# Patient Record
Sex: Female | Born: 1995 | Hispanic: No | Marital: Single | Smoking: Current every day smoker
Health system: Southern US, Community
[De-identification: ages and names within clinical notes are randomized; demographics above are authoritative.]

## PROBLEM LIST (undated history)

## (undated) DIAGNOSIS — E063 Autoimmune thyroiditis: Secondary | ICD-10-CM

## (undated) DIAGNOSIS — Z789 Other specified health status: Secondary | ICD-10-CM

## (undated) DIAGNOSIS — E039 Hypothyroidism, unspecified: Secondary | ICD-10-CM

## (undated) DIAGNOSIS — F419 Anxiety disorder, unspecified: Secondary | ICD-10-CM

## (undated) HISTORY — DX: Anxiety disorder, unspecified: F41.9

## (undated) HISTORY — DX: Hypothyroidism, unspecified: E03.9

## (undated) HISTORY — PX: TONSILLECTOMY: SUR1361

## (undated) HISTORY — DX: Autoimmune thyroiditis: E06.3

---
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
## 2017-11-24 ENCOUNTER — Encounter (HOSPITAL_COMMUNITY): Payer: Self-pay | Admitting: Emergency Medicine

## 2017-11-24 DIAGNOSIS — Z5321 Procedure and treatment not carried out due to patient leaving prior to being seen by health care provider: Secondary | ICD-10-CM | POA: Insufficient documentation

## 2017-11-24 DIAGNOSIS — L509 Urticaria, unspecified: Secondary | ICD-10-CM | POA: Insufficient documentation

## 2017-11-24 NOTE — ED Triage Notes (Signed)
Patient reports persistent generalized itchy skin hives onset last night unrelieved by oral Benadryl , seen at an urgent care this evening received " steroid shot" and prescriptions with no relief . Airway intact , no oral swelling or respiratory distress.

## 2017-11-25 ENCOUNTER — Emergency Department (HOSPITAL_COMMUNITY)
Admission: EM | Admit: 2017-11-25 | Discharge: 2017-11-25 | Discharge status: Against Medical Advice | Payer: Self-pay | Attending: Emergency Medicine | Admitting: Emergency Medicine

## 2017-11-25 NOTE — ED Notes (Signed)
No answer when called for room x 2 

## 2017-11-25 NOTE — ED Notes (Signed)
No answer in waiting area x 3  

## 2018-11-25 ENCOUNTER — Other Ambulatory Visit: Payer: Self-pay

## 2018-11-25 ENCOUNTER — Emergency Department (HOSPITAL_BASED_OUTPATIENT_CLINIC_OR_DEPARTMENT_OTHER)
Admission: EM | Admit: 2018-11-25 | Discharge: 2018-11-25 | Disposition: A | Discharge status: Home / Self Care | Payer: PRIVATE HEALTH INSURANCE | Attending: Emergency Medicine | Admitting: Emergency Medicine

## 2018-11-25 ENCOUNTER — Encounter (HOSPITAL_BASED_OUTPATIENT_CLINIC_OR_DEPARTMENT_OTHER): Payer: Self-pay

## 2018-11-25 DIAGNOSIS — R197 Diarrhea, unspecified: Secondary | ICD-10-CM | POA: Insufficient documentation

## 2018-11-25 DIAGNOSIS — F17200 Nicotine dependence, unspecified, uncomplicated: Secondary | ICD-10-CM | POA: Insufficient documentation

## 2018-11-25 DIAGNOSIS — R112 Nausea with vomiting, unspecified: Secondary | ICD-10-CM | POA: Insufficient documentation

## 2018-11-25 DIAGNOSIS — R1084 Generalized abdominal pain: Secondary | ICD-10-CM | POA: Insufficient documentation

## 2018-11-25 LAB — CBC WITH DIFFERENTIAL/PLATELET
Abs Immature Granulocytes: 0.02 10*3/uL (ref 0.00–0.07)
Basophils Absolute: 0 10*3/uL (ref 0.0–0.1)
Basophils Relative: 0 %
Eosinophils Absolute: 0 10*3/uL (ref 0.0–0.5)
Eosinophils Relative: 1 %
HCT: 39.7 % (ref 36.0–46.0)
Hemoglobin: 13.1 g/dL (ref 12.0–15.0)
Immature Granulocytes: 0 %
Lymphocytes Relative: 15 %
Lymphs Abs: 0.9 10*3/uL (ref 0.7–4.0)
MCH: 31 pg (ref 26.0–34.0)
MCHC: 33 g/dL (ref 30.0–36.0)
MCV: 94.1 fL (ref 80.0–100.0)
Monocytes Absolute: 0.3 10*3/uL (ref 0.1–1.0)
Monocytes Relative: 5 %
Neutro Abs: 4.5 10*3/uL (ref 1.7–7.7)
Neutrophils Relative %: 79 %
Platelets: 174 10*3/uL (ref 150–400)
RBC: 4.22 MIL/uL (ref 3.87–5.11)
RDW: 13 % (ref 11.5–15.5)
WBC: 5.7 10*3/uL (ref 4.0–10.5)
nRBC: 0 % (ref 0.0–0.2)

## 2018-11-25 LAB — COMPREHENSIVE METABOLIC PANEL
ALT: 10 U/L (ref 0–44)
AST: 15 U/L (ref 15–41)
Albumin: 3.8 g/dL (ref 3.5–5.0)
Alkaline Phosphatase: 31 U/L — ABNORMAL LOW (ref 38–126)
Anion gap: 7 (ref 5–15)
BUN: 13 mg/dL (ref 6–20)
CO2: 22 mmol/L (ref 22–32)
Calcium: 8.3 mg/dL — ABNORMAL LOW (ref 8.9–10.3)
Chloride: 106 mmol/L (ref 98–111)
Creatinine, Ser: 0.74 mg/dL (ref 0.44–1.00)
GFR calc Af Amer: >60 mL/min (ref >60)
GFR calc non Af Amer: >60 mL/min (ref >60)
Glucose, Bld: 90 mg/dL (ref 70–99)
Potassium: 3.7 mmol/L (ref 3.5–5.1)
SODIUM: 135 mmol/L (ref 135–145)
Total Bilirubin: 0.9 mg/dL (ref 0.3–1.2)
Total Protein: 6.3 g/dL — ABNORMAL LOW (ref 6.5–8.1)

## 2018-11-25 LAB — URINALYSIS, ROUTINE W REFLEX MICROSCOPIC
GLUCOSE, UA: NEGATIVE mg/dL
Ketones, ur: 40 mg/dL — AB
LEUKOCYTES UA: NEGATIVE
NITRITE: NEGATIVE
PH: 5.5 (ref 5.0–8.0)
PROTEIN: NEGATIVE mg/dL
Specific Gravity, Urine: >1.03 — ABNORMAL HIGH (ref 1.005–1.030)

## 2018-11-25 LAB — URINALYSIS, MICROSCOPIC (REFLEX)

## 2018-11-25 LAB — LIPASE, BLOOD: Lipase: 23 U/L (ref 11–51)

## 2018-11-25 LAB — PREGNANCY, URINE: Preg Test, Ur: NEGATIVE

## 2018-11-25 MED ORDER — SODIUM CHLORIDE 0.9 % IV BOLUS
1000.0000 mL | Freq: Once | INTRAVENOUS | Status: AC
  Administered 2018-11-25: 1000 mL via INTRAVENOUS

## 2018-11-25 MED ORDER — KETOROLAC TROMETHAMINE 30 MG/ML IJ SOLN
30.0000 mg | Freq: Once | INTRAMUSCULAR | Status: AC
  Administered 2018-11-25: 30 mg via INTRAVENOUS
  Filled 2018-11-25: qty 1

## 2018-11-25 MED ORDER — ACETAMINOPHEN 500 MG PO TABS: 500.0000 mg | ORAL_TABLET | Freq: Four times a day (QID) | ORAL | 0 refills | Status: DC | PRN

## 2018-11-25 MED ORDER — ONDANSETRON HCL 4 MG PO TABS: 4.0000 mg | ORAL_TABLET | Freq: Four times a day (QID) | ORAL | 0 refills | Status: DC

## 2018-11-25 MED ORDER — IBUPROFEN 600 MG PO TABS: 600.0000 mg | ORAL_TABLET | Freq: Four times a day (QID) | ORAL | 0 refills | Status: DC | PRN

## 2018-11-25 MED ORDER — DICYCLOMINE HCL 20 MG PO TABS: 20.0000 mg | ORAL_TABLET | Freq: Two times a day (BID) | ORAL | 0 refills | Status: DC

## 2018-11-25 MED ORDER — HYDROMORPHONE HCL 1 MG/ML IJ SOLN: 1.0000 mg | Freq: Once | INTRAMUSCULAR | Status: DC

## 2018-11-25 NOTE — Discharge Instructions (Signed)
Take Bentyl as prescribed, as needed for cramping.  Take Zofran every 6 hours as needed for nausea or vomiting.  Take Tylenol and ibuprofen as needed for pain.  Make sure to drink plenty of fluids.  Progress your diet as tolerated.  Begin with bland foods such as bananas, rice, applesauce after you are tolerating clear liquids.  Avoid fried, greasy, fatty, spicy foods initially.  Please return to the emergency department develop any new or worsening symptoms including persistent fever over 100.4, localized right lower quadrant pain, intractable vomiting, or any other concerning symptoms.

## 2018-11-25 NOTE — ED Provider Notes (Signed)
[facility] EMERGENCY DEPARTMENT Provider Note   CSN: [id number] Arrival date & time: 11/25/18  1505     History   Chief Complaint Chief Complaint  Patient presents with  . Abdominal Pain    HPI Julie Bowman is a 23 y.o. female who is previously healthy who presents with abdominal pain, nausea, vomiting, diarrhea that began yesterday.  Her boyfriend and roommates have similar symptoms.  She describes her pain is all over, intermittent, and crampy.  She has had some radiation of pain to her back.  She denies any abnormal foods or recent travel.  She is had mostly abdominal pain with some intermittent vomiting and diarrhea.  No bloody stools.  She denies any urinary symptoms, abnormal vaginal bleeding or discharge.  She has had some associated chills, but no fevers.  She was seen at urgent care and was sent here for further evaluation.  HPI  History reviewed. No pertinent past medical history.  There are no active problems to display for this patient.   Past Surgical History:  Procedure Laterality Date  . TONSILLECTOMY       OB History   No obstetric history on file.      Home Medications    Prior to Admission medications   Medication Sig Start Date End Date Taking? Authorizing Provider  acetaminophen (TYLENOL) 500 MG tablet Take 1 tablet (500 mg total) by mouth every 6 (six) hours as needed. 11/25/18   Dilara Navarrete, Waylan Boga, PA-C  dicyclomine (BENTYL) 20 MG tablet Take 1 tablet (20 mg total) by mouth 2 (two) times daily. 11/25/18   Sully Manzi, Waylan Boga, PA-C  ibuprofen (ADVIL,MOTRIN) 600 MG tablet Take 1 tablet (600 mg total) by mouth every 6 (six) hours as needed. 11/25/18   Charnae Lill, Waylan Boga, PA-C  ondansetron (ZOFRAN) 4 MG tablet Take 1 tablet (4 mg total) by mouth every 6 (six) hours. 11/25/18   Emi Holes, PA-C    Family History Family History  Problem Relation Age of Onset  . Cancer Mother     Social History Social History   Tobacco Use  . Smoking  status: Current Every Day Smoker  . Smokeless tobacco: Never Used  Substance Use Topics  . Alcohol use: Yes    Comment: occ  . Drug use: Not Currently     Allergies   Patient has no known allergies.   Review of Systems Review of Systems  Constitutional: Negative for chills and fever.  HENT: Negative for facial swelling and sore throat.   Respiratory: Negative for shortness of breath.   Cardiovascular: Negative for chest pain.  Gastrointestinal: Positive for abdominal pain, diarrhea, nausea and vomiting.  Genitourinary: Negative for dysuria.  Musculoskeletal: Negative for back pain.  Skin: Negative for rash and wound.  Neurological: Negative for headaches.  Psychiatric/Behavioral: The patient is not nervous/anxious.      Physical Exam Updated Vital Signs BP 104/79   Pulse 84   Temp 98.8 F (37.1 C) (Oral)   Resp 16   Ht 5' (1.524 m)   Wt 50.3 kg   LMP 11/02/2018   SpO2 100%   BMI 21.68 kg/m   Physical Exam Vitals signs and nursing note reviewed.  Constitutional:      General: She is not in acute distress.    Appearance: She is well-developed. She is not diaphoretic.  HENT:     Head: Normocephalic and atraumatic.     Mouth/Throat:     Pharynx: No oropharyngeal exudate.  Eyes:  General: No scleral icterus.       Right eye: No discharge.        Left eye: No discharge.     Conjunctiva/sclera: Conjunctivae normal.     Pupils: Pupils are equal, round, and reactive to light.  Neck:     Musculoskeletal: Normal range of motion and neck supple.     Thyroid: No thyromegaly.  Cardiovascular:     Rate and Rhythm: Normal rate and regular rhythm.     Heart sounds: Normal heart sounds. No murmur. No friction rub. No gallop.   Pulmonary:     Effort: Pulmonary effort is normal. No respiratory distress.     Breath sounds: Normal breath sounds. No stridor. No wheezing or rales.  Abdominal:     General: Bowel sounds are normal. There is no distension.     Palpations:  Abdomen is soft.     Tenderness: There is abdominal tenderness in the right lower quadrant and suprapubic area. There is no right CVA tenderness, left CVA tenderness, guarding or rebound. Negative signs include Murphy's sign.  Lymphadenopathy:     Cervical: No cervical adenopathy.  Skin:    General: Skin is warm and dry.     Coloration: Skin is not pale.     Findings: No rash.  Neurological:     Mental Status: She is alert.     Coordination: Coordination normal.      ED Treatments / Results  Labs (all labs ordered are listed, but only abnormal results are displayed) Labs Reviewed  URINALYSIS, ROUTINE W REFLEX MICROSCOPIC - Abnormal; Notable for the following components:      Result Value   Color, Urine AMBER (*)    Specific Gravity, Urine >1.030 (*)    Hgb urine dipstick SMALL (*)    Bilirubin Urine SMALL (*)    Ketones, ur 40 (*)    All other components within normal limits  URINALYSIS, MICROSCOPIC (REFLEX) - Abnormal; Notable for the following components:   Bacteria, UA FEW (*)    All other components within normal limits  COMPREHENSIVE METABOLIC PANEL - Abnormal; Notable for the following components:   Calcium 8.3 (*)    Total Protein 6.3 (*)    Alkaline Phosphatase 31 (*)    All other components within normal limits  PREGNANCY, URINE  CBC WITH DIFFERENTIAL/PLATELET  LIPASE, BLOOD    EKG None  Radiology No results found.  Procedures Procedures (including critical care time)  Medications Ordered in ED Medications  sodium chloride 0.9 % bolus 1,000 mL (0 mLs Intravenous Stopped 11/25/18 1815)  sodium chloride 0.9 % bolus 1,000 mL ( Intravenous Stopped 11/25/18 1924)  ketorolac (TORADOL) 30 MG/ML injection 30 mg (30 mg Intravenous Given 11/25/18 1822)     Initial Impression / Assessment and Plan / ED Course  I have reviewed the triage vital signs and the nursing notes.  Pertinent labs & imaging results that were available during my care of the patient were  reviewed by me and considered in my medical decision making (see chart for details).  Clinical Course as of Nov 26 1514  Fri Nov 25, 2018  1821 On repeat exam, patient no longer having right lower quadrant tenderness, suprapubic pressure only.  Patient reports she has been spotting as she is about to start her menstrual cycle.  I discussed a pelvic exam with her, however she has never had a pelvic exam and declines today.  I feel this is reasonable, as this seems to be more of  a viral etiology.  I discussed, using shared decision making, the risks and benefits of CT.  It is unlikely considering patient's several sick contacts that patient has something different.  Labs are very reassuring.  She has a normal white blood cell count.  We will continue serial abdominal exams while hydrating.   [AL]    Clinical Course User Index [AL] Emi Holes, PA-C    Patient presenting with abdominal pain with nausea, vomiting, diarrhea.  She has several sick contacts at home with similar symptoms.  Her abdominal exam is very inconsistent.  There was some right lower quadrant tenderness, however then it changed to epigastric tenderness.  Using shared decision making, patient opts to defer CT at this time.  Patient understands to return if she develops persistent, localized right lower quadrant tenderness, fever over 100.4, intractable vomiting, or any other unrelenting symptoms.  Patient labs are unremarkable.  However, patient was found to be dehydrated with ketones in the urine.  Patient given 2 L of normal saline, Toradol.  Patient feeling improved.  Will discharge home with Bentyl, Zofran.  Patient understands and agrees with plan.  Patient vital stable throughout ED course and discharged in satisfactory condition.  Patient also via via my attending, Dr. Pilar Plate, who guided the patient's management and agrees with plan.  Final Clinical Impressions(s) / ED Diagnoses   Final diagnoses:  Nausea vomiting and  diarrhea  Generalized abdominal pain    ED Discharge Orders         Ordered    dicyclomine (BENTYL) 20 MG tablet  2 times daily     11/25/18 1923    ondansetron (ZOFRAN) 4 MG tablet  Every 6 hours     11/25/18 1923    ibuprofen (ADVIL,MOTRIN) 600 MG tablet  Every 6 hours PRN     11/25/18 1923    acetaminophen (TYLENOL) 500 MG tablet  Every 6 hours PRN     11/25/18 1923           Emi Holes, PA-C 11/26/18 1516    Sabas Sous, MD 11/26/18 [phone]

## 2018-11-25 NOTE — ED Triage Notes (Signed)
C/o abd pain, n/v/d started yesterday-sent by [facility]-steady gait

## 2020-09-20 ENCOUNTER — Encounter (HOSPITAL_COMMUNITY): Payer: Self-pay | Admitting: Obstetrics and Gynecology

## 2020-09-20 ENCOUNTER — Telehealth: Payer: Self-pay | Admitting: Obstetrics and Gynecology

## 2020-09-20 ENCOUNTER — Inpatient Hospital Stay (HOSPITAL_COMMUNITY): Payer: Medicaid Other

## 2020-09-20 ENCOUNTER — Inpatient Hospital Stay (HOSPITAL_COMMUNITY)
Admission: AD | Admit: 2020-09-20 | Discharge: 2020-09-20 | Disposition: A | Discharge status: Home / Self Care | Payer: Medicaid Other | Attending: Obstetrics and Gynecology | Admitting: Obstetrics and Gynecology

## 2020-09-20 ENCOUNTER — Other Ambulatory Visit: Payer: Self-pay

## 2020-09-20 DIAGNOSIS — Z87891 Personal history of nicotine dependence: Secondary | ICD-10-CM | POA: Insufficient documentation

## 2020-09-20 DIAGNOSIS — R109 Unspecified abdominal pain: Secondary | ICD-10-CM | POA: Diagnosis not present

## 2020-09-20 DIAGNOSIS — O209 Hemorrhage in early pregnancy, unspecified: Secondary | ICD-10-CM | POA: Insufficient documentation

## 2020-09-20 DIAGNOSIS — Z882 Allergy status to sulfonamides status: Secondary | ICD-10-CM | POA: Insufficient documentation

## 2020-09-20 DIAGNOSIS — O26891 Other specified pregnancy related conditions, first trimester: Secondary | ICD-10-CM | POA: Insufficient documentation

## 2020-09-20 DIAGNOSIS — Z79899 Other long term (current) drug therapy: Secondary | ICD-10-CM | POA: Insufficient documentation

## 2020-09-20 DIAGNOSIS — O4690 Antepartum hemorrhage, unspecified, unspecified trimester: Secondary | ICD-10-CM

## 2020-09-20 DIAGNOSIS — O26899 Other specified pregnancy related conditions, unspecified trimester: Secondary | ICD-10-CM

## 2020-09-20 DIAGNOSIS — Z640 Problems related to unwanted pregnancy: Secondary | ICD-10-CM | POA: Diagnosis not present

## 2020-09-20 DIAGNOSIS — O3680X0 Pregnancy with inconclusive fetal viability, not applicable or unspecified: Secondary | ICD-10-CM

## 2020-09-20 DIAGNOSIS — Z3A00 Weeks of gestation of pregnancy not specified: Secondary | ICD-10-CM

## 2020-09-20 HISTORY — DX: Other specified health status: Z78.9

## 2020-09-20 LAB — CBC WITH DIFFERENTIAL/PLATELET
Abs Immature Granulocytes: 0.03 10*3/uL (ref 0.00–0.07)
Basophils Absolute: 0 10*3/uL (ref 0.0–0.1)
Basophils Relative: 1 %
Eosinophils Absolute: 0.2 10*3/uL (ref 0.0–0.5)
Eosinophils Relative: 2 %
HCT: 41.4 % (ref 36.0–46.0)
Hemoglobin: 14 g/dL (ref 12.0–15.0)
Immature Granulocytes: 0 %
Lymphocytes Relative: 28 %
Lymphs Abs: 2.1 10*3/uL (ref 0.7–4.0)
MCH: 31.5 pg (ref 26.0–34.0)
MCHC: 33.8 g/dL (ref 30.0–36.0)
MCV: 93.2 fL (ref 80.0–100.0)
Monocytes Absolute: 0.4 10*3/uL (ref 0.1–1.0)
Monocytes Relative: 5 %
Neutro Abs: 4.7 10*3/uL (ref 1.7–7.7)
Neutrophils Relative %: 64 %
Platelets: 232 10*3/uL (ref 150–400)
RBC: 4.44 MIL/uL (ref 3.87–5.11)
RDW: 12.3 % (ref 11.5–15.5)
WBC: 7.4 10*3/uL (ref 4.0–10.5)
nRBC: 0 % (ref 0.0–0.2)

## 2020-09-20 LAB — URINALYSIS, ROUTINE W REFLEX MICROSCOPIC
Bacteria, UA: NONE SEEN
Bilirubin Urine: NEGATIVE
Glucose, UA: NEGATIVE mg/dL
Ketones, ur: 20 mg/dL — AB
Leukocytes,Ua: NEGATIVE
Nitrite: NEGATIVE
Protein, ur: NEGATIVE mg/dL
Specific Gravity, Urine: 1.012 (ref 1.005–1.030)
pH: 5 (ref 5.0–8.0)

## 2020-09-20 LAB — COMPREHENSIVE METABOLIC PANEL
ALT: 11 U/L (ref 0–44)
AST: 15 U/L (ref 15–41)
Albumin: 4.5 g/dL (ref 3.5–5.0)
Alkaline Phosphatase: 37 U/L — ABNORMAL LOW (ref 38–126)
Anion gap: 11 (ref 5–15)
BUN: 11 mg/dL (ref 6–20)
CO2: 23 mmol/L (ref 22–32)
Calcium: 9.6 mg/dL (ref 8.9–10.3)
Chloride: 102 mmol/L (ref 98–111)
Creatinine, Ser: 0.91 mg/dL (ref 0.44–1.00)
GFR, Estimated: >60 mL/min (ref >60)
Glucose, Bld: 109 mg/dL — ABNORMAL HIGH (ref 70–99)
Potassium: 3.7 mmol/L (ref 3.5–5.1)
Sodium: 136 mmol/L (ref 135–145)
Total Bilirubin: 1.2 mg/dL (ref 0.3–1.2)
Total Protein: 7.2 g/dL (ref 6.5–8.1)

## 2020-09-20 LAB — ABO/RH: ABO/RH(D): O POS

## 2020-09-20 LAB — POCT PREGNANCY, URINE: Preg Test, Ur: POSITIVE — AB

## 2020-09-20 LAB — HCG, QUANTITATIVE, PREGNANCY: hCG, Beta Chain, Quant, S: 1323 m[IU]/mL — ABNORMAL HIGH (ref <5)

## 2020-09-20 NOTE — MAU Provider Note (Signed)
History     CSN: [id number]  Arrival date and time: 09/20/20 1211   First Provider Initiated Contact with Patient 09/20/20 1341      Chief Complaint  Patient presents with  . Abdominal Pain  . Vaginal Bleeding  . Possible Pregnancy   HPI   [name] is a 24 y.o. female G1P0 @ [redacted]w[redacted]d here in MAU with complaints of spotting in the last week, none now, and concerns about where the pregnancy is located. She was seen at [facility] choice clinic today, and they were unable to see anything in the uterus on ultrasound. She came here for essentially a second opinion. She reports light pink/brown bloody discharge over the last few days.  This is an undesired pregnancy and her and partner are considering abortion. She is concerned about ectopic.  No pain now.   Nov 7 was first positive pregnancy test. Certain LMP   OB History    Gravida  1   Para      Term      Preterm      AB      Living        SAB      TAB      Ectopic      Multiple      Live Births              Past Medical History:  Diagnosis Date  . Medical history non-contributory     Past Surgical History:  Procedure Laterality Date  . TONSILLECTOMY      Family History  Problem Relation Age of Onset  . Cancer Mother     Social History   Tobacco Use  . Smoking status: Former Games developer  . Smokeless tobacco: Never Used  Vaping Use  . Vaping Use: Every day  Substance Use Topics  . Alcohol use: Not Currently    Comment: occ  . Drug use: Not Currently    Allergies:  Allergies  Allergen Reactions  . Sulfa Antibiotics Other (See Comments)    Unsure of reaction mother told her she was alergic    Medications Prior to Admission  Medication Sig Dispense Refill Last Dose  . ibuprofen (ADVIL,MOTRIN) 600 MG tablet Take 1 tablet (600 mg total) by mouth every 6 (six) hours as needed. 30 tablet 0 Past Week at Unknown time  . acetaminophen (TYLENOL) 500 MG tablet Take 1 tablet (500 mg total) by mouth  every 6 (six) hours as needed. 30 tablet 0   . dicyclomine (BENTYL) 20 MG tablet Take 1 tablet (20 mg total) by mouth 2 (two) times daily. 20 tablet 0   . ondansetron (ZOFRAN) 4 MG tablet Take 1 tablet (4 mg total) by mouth every 6 (six) hours. 12 tablet 0    Results for orders placed or performed during the hospital encounter of 09/20/20 (from the past 48 hour(s))  Urinalysis, Routine w reflex microscopic Urine, Clean Catch     Status: Abnormal   Collection Time: 09/20/20 12:46 PM  Result Value Ref Range   Color, Urine YELLOW YELLOW   APPearance CLEAR CLEAR   Specific Gravity, Urine 1.012 1.005 - 1.030   pH 5.0 5.0 - 8.0   Glucose, UA NEGATIVE NEGATIVE mg/dL   Hgb urine dipstick SMALL (A) NEGATIVE   Bilirubin Urine NEGATIVE NEGATIVE   Ketones, ur 20 (A) NEGATIVE mg/dL   Protein, ur NEGATIVE NEGATIVE mg/dL   Nitrite NEGATIVE NEGATIVE   Leukocytes,Ua NEGATIVE NEGATIVE   WBC, UA 0-5 0 -  5 WBC/hpf   Bacteria, UA NONE SEEN NONE SEEN   Squamous Epithelial / LPF 0-5 0 - 5    Comment: Performed at [facility], [address] [place], [place] [address]  Pregnancy, urine POC     Status: Abnormal   Collection Time: 09/20/20 12:56 PM  Result Value Ref Range   Preg Test, Ur POSITIVE (A) NEGATIVE    Comment:        THE SENSITIVITY OF THIS METHODOLOGY IS >24 mIU/mL   CBC with Differential/Platelet     Status: None   Collection Time: 09/20/20  2:18 PM  Result Value Ref Range   WBC 7.4 4.0 - 10.5 K/uL   RBC 4.44 3.87 - 5.11 MIL/uL   Hemoglobin 14.0 12.0 - 15.0 g/dL   HCT 60.441.4 36 - 46 %   MCV 93.2 80.0 - 100.0 fL   MCH 31.5 26.0 - 34.0 pg   MCHC 33.8 30.0 - 36.0 g/dL   RDW 54.012.3 98.111.5 - 19.115.5 %   Platelets 232 150 - 400 K/uL   nRBC 0.0 0.0 - 0.2 %   Neutrophils Relative % 64 %   Neutro Abs 4.7 1.7 - 7.7 K/uL   Lymphocytes Relative 28 %   Lymphs Abs 2.1 0.7 - 4.0 K/uL   Monocytes Relative 5 %   Monocytes Absolute 0.4 0.1 - 1.0 K/uL   Eosinophils Relative 2 %   Eosinophils  Absolute 0.2 0.0 - 0.5 K/uL   Basophils Relative 1 %   Basophils Absolute 0.0 0.0 - 0.1 K/uL   Immature Granulocytes 0 %   Abs Immature Granulocytes 0.03 0.00 - 0.07 K/uL    Comment: Performed at [facility], [address] [place], [place] [address]  Comprehensive metabolic panel     Status: Abnormal   Collection Time: 09/20/20  2:18 PM  Result Value Ref Range   Sodium 136 135 - 145 mmol/L   Potassium 3.7 3.5 - 5.1 mmol/L   Chloride 102 98 - 111 mmol/L   CO2 23 22 - 32 mmol/L   Glucose, Bld 109 (H) 70 - 99 mg/dL    Comment: Glucose reference range applies only to samples taken after fasting for at least 8 hours.   BUN 11 6 - 20 mg/dL   Creatinine, Ser 5.620.91 0.44 - 1.00 mg/dL   Calcium 9.6 8.9 - 13.010.3 mg/dL   Total Protein 7.2 6.5 - 8.1 g/dL   Albumin 4.5 3.5 - 5.0 g/dL   AST 15 15 - 41 U/L   ALT 11 0 - 44 U/L   Alkaline Phosphatase 37 (L) 38 - 126 U/L   Total Bilirubin 1.2 0.3 - 1.2 mg/dL   GFR, Estimated >86>60 >57>60 mL/min    Comment: (NOTE) Calculated using the CKD-EPI Creatinine Equation (2021)    Anion gap 11 5 - 15    Comment: Performed at [facility], [address] [place], [place] [address]  ABO/Rh     Status: None   Collection Time: 09/20/20  2:18 PM  Result Value Ref Range   ABO/RH(D) O POS    No rh immune globuloin      NOT A RH IMMUNE GLOBULIN CANDIDATE, PT RH POSITIVE Performed at [facility], [address] [place], [place] [address]   hCG, quantitative, pregnancy     Status: Abnormal   Collection Time: 09/20/20  2:18 PM  Result Value Ref Range   hCG, Beta Chain, Quant, S 1,323 (H) <5  mIU/mL    Comment:          GEST. AGE      CONC.  (mIU/mL)   <=1 WEEK        5 - 50     2 WEEKS       50 - 500     3 WEEKS       100 - 10,000     4 WEEKS     1,000 - 30,000     5 WEEKS     3,500 - 115,000   6-8 WEEKS     12,000 - 270,000    12 WEEKS     15,000 - 220,000        FEMALE AND NON-PREGNANT FEMALE:     LESS THAN 5 mIU/mL Performed at [facility], [address], [place], [place] [address]    US OB LESS THAN 14 WEEKS WITH OB TRANSVAGINAL  Result Date: 09/20/2020 CLINICAL DATA:  Cramping, vaginal bleeding EXAM: OBSTETRIC <14 WK [name] AND TRANSVAGINAL OB US TECHNIQUE: Both transabdominal and transvaginal ultrasound examinations were performed for complete evaluation of the gestation as well as the maternal uterus, adnexal regions, and pelvic cul-de-sac. Transvaginal technique was performed to assess early pregnancy. COMPARISON:  None. FINDINGS: Intrauterine gestational sac: None Yolk sac:  Not Visualized. Embryo:  Not Visualized. Cardiac Activity: Not Visualized. Maternal uterus/adnexae: Left ovary measures 2.8 x 2.5 x 2.6 cm, with possible corpus luteum cyst measuring 1.2 cm. The right ovary measures 3.0 x 1.6 x 2.2 cm. Endometrium measures 14 mm in thickness. No evidence of intrauterine pregnancy. No free fluid. IMPRESSION: 1. No evidence of intrauterine pregnancy at this time. Serial beta HCG measurements and follow-up ultrasound may be useful. 2. Probable corpus luteum cyst left ovary. Electronically Signed   By: Sharlet Salina M.D.   On: 09/20/2020 14:58   Review of Systems  Constitutional: Negative for fever.  Gastrointestinal: Negative for abdominal pain.  Genitourinary: Positive for vaginal bleeding. Negative for vaginal discharge.   Physical Exam   Blood pressure 115/81, pulse 83, temperature 99.2 F (37.3 C), temperature source Oral, resp. rate 16, height 5' (1.524 m), weight 53.8 kg, last menstrual period 08/08/2020, SpO2 100 %.  Physical Exam Constitutional:      General: She is not in acute distress.    Appearance: She is well-developed. She is not ill-appearing or toxic-appearing.  Abdominal:     Tenderness: There is no abdominal tenderness.  Genitourinary:    Comments: Cervix: Closed, thick, posterior. No blood noted on exam.  Adley Mazurowski, Harolyn Rutherford, NP  Neurological:     Mental Status: She is alert.    MAU  Course  Procedures  None  MDM  Patient is self pay w/o insurance. Did not collect vaginal swabs or HIV test today d/t cost. She is not concerned and plans to have this done at the HD. [name] reviewed with patient. Labs reviewed with patient.  O positive blood type  Assessment and Plan   A:  1. Pregnancy of unknown anatomic location   2. Abdominal pain affecting pregnancy   3. Vaginal bleeding in pregnancy   4. Unwanted pregnancy     P:  Discharge home in stable condition Ectopic precautions F/[facility]. If ? Ectopic based on repeat Quant, she may be offered treatment on Monday.  Strict return precautions, return to MAU if symptoms worsen Pelvic rest  Bleeding precautions.   Jadon Harbaugh, Victorino Dike  I, NP 09/20/2020 6:58 PM

## 2020-09-20 NOTE — MAU Note (Signed)
Went to a clinic [facility]".  They did an US,nothing showed up.  Was told it could she is early or she could have an ectopic. Has had several +HPT.  Having light cramping and spotting.

## 2020-09-23 ENCOUNTER — Ambulatory Visit (INDEPENDENT_AMBULATORY_CARE_PROVIDER_SITE_OTHER): Payer: Self-pay

## 2020-09-23 ENCOUNTER — Other Ambulatory Visit: Payer: Self-pay

## 2020-09-23 VITALS — BP 125/77 | HR 75 | Wt 121.1 lb

## 2020-09-23 DIAGNOSIS — O3680X0 Pregnancy with inconclusive fetal viability, not applicable or unspecified: Secondary | ICD-10-CM

## 2020-09-23 LAB — BETA HCG QUANT (REF LAB): hCG Quant: 4443 m[IU]/mL

## 2020-09-23 NOTE — Progress Notes (Signed)
Opened in error

## 2020-09-23 NOTE — Progress Notes (Signed)
Julie Bowman presents to [facility] for follow-up quant hCG blood draw today. She was seen in MAU for vaginal bleeding on 09/20/20. Patient denies pain or bleeding today. Discussed with patient, we are following hCG levels today. Results will be back in approximately 2 hours. Valid contact number for patient confirmed. I will call the patient with results.    Beta HCG today is 4443 which has risen from 1,323 on 09/20/20. Results and patient history reviewed with Donavan Foil, MD who states this is an appropriate rise and pt should be scheduled for [name] in 7-10 days. Called pt at 1200; VM is full, cannot leave message. Will call again later today.  Pt returned phone call and left VM on nurse line. Called pt at 1702. Result and provider recommendation given. First available [name] scheduled on 10/07/20 at 0800; 0745. Pt aware she will be called with results by a provider after the [name] visit.  [name] 09/23/2020 9:03 AM

## 2020-09-24 NOTE — Progress Notes (Signed)
Patient was assessed and managed by nursing staff during this encounter. I have reviewed the chart and agree with the documentation and plan. I have also made any necessary editorial changes.  Warden Fillers, MD 09/24/2020 9:57 AM

## 2020-09-25 NOTE — Telephone Encounter (Signed)
error 

## 2020-09-27 ENCOUNTER — Other Ambulatory Visit: Payer: Self-pay

## 2020-09-27 ENCOUNTER — Inpatient Hospital Stay (HOSPITAL_COMMUNITY): Payer: Medicaid Other

## 2020-09-27 ENCOUNTER — Inpatient Hospital Stay (HOSPITAL_COMMUNITY)
Admission: AD | Admit: 2020-09-27 | Discharge: 2020-09-27 | Disposition: A | Discharge status: Home / Self Care | Payer: Medicaid Other | Attending: Obstetrics and Gynecology | Admitting: Obstetrics and Gynecology

## 2020-09-27 ENCOUNTER — Encounter (HOSPITAL_COMMUNITY): Payer: Self-pay | Admitting: Obstetrics and Gynecology

## 2020-09-27 DIAGNOSIS — O209 Hemorrhage in early pregnancy, unspecified: Secondary | ICD-10-CM | POA: Insufficient documentation

## 2020-09-27 DIAGNOSIS — Z3A01 Less than 8 weeks gestation of pregnancy: Secondary | ICD-10-CM | POA: Insufficient documentation

## 2020-09-27 DIAGNOSIS — Z3491 Encounter for supervision of normal pregnancy, unspecified, first trimester: Secondary | ICD-10-CM

## 2020-09-27 DIAGNOSIS — Z6790 Unspecified blood type, Rh positive: Secondary | ICD-10-CM

## 2020-09-27 DIAGNOSIS — Z87891 Personal history of nicotine dependence: Secondary | ICD-10-CM | POA: Insufficient documentation

## 2020-09-27 DIAGNOSIS — Z6710 Type A blood, Rh positive: Secondary | ICD-10-CM

## 2020-09-27 DIAGNOSIS — O3680X0 Pregnancy with inconclusive fetal viability, not applicable or unspecified: Secondary | ICD-10-CM

## 2020-09-27 LAB — HCG, QUANTITATIVE, PREGNANCY: hCG, Beta Chain, Quant, S: 19814 m[IU]/mL — ABNORMAL HIGH (ref <5)

## 2020-09-27 NOTE — MAU Note (Signed)
Presents with c/o VB that began @ 1100 this morning. States VB is similar to menstrual cycle.  Reports slight abdominal cramping.  Denies recent intercourse.

## 2020-09-27 NOTE — Discharge Instructions (Signed)

## 2020-09-27 NOTE — MAU Provider Note (Signed)
Chief Complaint: Vaginal Bleeding   First Provider Initiated Contact with Patient 09/27/20 1227      SUBJECTIVE HPI: Julie Bowman is a 24 y.o. G1P0 at [redacted]w[redacted]d by LMP who presents to maternity admissions reporting onset of menstrual like bleeding today after exercising. She reports mild abdominal cramping associated with the bleeding. She has not tried any treatments.     Location: lower abdomen Quality: cramping Severity: 2/10 on pain scale Duration: 1 day Timing: intermittent Modifying factors: none Associated signs and symptoms: vaginal bleeding  HPI  Past Medical History:  Diagnosis Date  . Medical history non-contributory    Past Surgical History:  Procedure Laterality Date  . TONSILLECTOMY     Social History   Socioeconomic History  . Marital status: Single    Spouse name: Not on file  . Number of children: Not on file  . Years of education: Not on file  . Highest education level: Not on file  Occupational History  . Not on file  Tobacco Use  . Smoking status: Former Games developer  . Smokeless tobacco: Never Used  Vaping Use  . Vaping Use: Every day  Substance and Sexual Activity  . Alcohol use: Not Currently    Comment: occ  . Drug use: Not Currently  . Sexual activity: Yes    Birth control/protection: None  Other Topics Concern  . Not on file  Social History Narrative  . Not on file   Social Determinants of Health   Financial Resource Strain:   . Difficulty of Paying Living Expenses: Not on file  Food Insecurity: No Food Insecurity  . Worried About Programme researcher, broadcasting/film/video in the Last Year: Never true  . Ran Out of Food in the Last Year: Never true  Transportation Needs: No Transportation Needs  . Lack of Transportation (Medical): No  . Lack of Transportation (Non-Medical): No  Physical Activity:   . Days of Exercise per Week: Not on file  . Minutes of Exercise per Session: Not on file  Stress:   . Feeling of Stress : Not on file  Social Connections:   .  Frequency of Communication with Friends and Family: Not on file  . Frequency of Social Gatherings with Friends and Family: Not on file  . Attends Religious Services: Not on file  . Active Member of Clubs or Organizations: Not on file  . Attends Banker Meetings: Not on file  . Marital Status: Not on file  Intimate Partner Violence:   . Fear of Current or Ex-Partner: Not on file  . Emotionally Abused: Not on file  . Physically Abused: Not on file  . Sexually Abused: Not on file   No current facility-administered medications on file prior to encounter.   Current Outpatient Medications on File Prior to Encounter  Medication Sig Dispense Refill  . acetaminophen (TYLENOL) 500 MG tablet Take 1 tablet (500 mg total) by mouth every 6 (six) hours as needed. 30 tablet 0  . dicyclomine (BENTYL) 20 MG tablet Take 1 tablet (20 mg total) by mouth 2 (two) times daily. 20 tablet 0   Allergies  Allergen Reactions  . Sulfa Antibiotics Other (See Comments)    Unsure of reaction mother told her she was alergic    ROS:  Review of Systems  Constitutional: Negative for chills, fatigue and fever.  Respiratory: Negative for shortness of breath.   Cardiovascular: Negative for chest pain.  Gastrointestinal: Positive for abdominal pain.  Genitourinary: Positive for vaginal bleeding. Negative for difficulty  urinating, dysuria, flank pain, pelvic pain, vaginal discharge and vaginal pain.  Neurological: Negative for dizziness and headaches.  Psychiatric/Behavioral: Negative.      I have reviewed patient's Past Medical Hx, Surgical Hx, Family Hx, Social Hx, medications and allergies.   Physical Exam   No data found. Constitutional: Well-developed, well-nourished female in no acute distress.  Cardiovascular: normal rate Respiratory: normal effort GI: Abd soft, non-tender. Pos BS x 4 MS: Extremities nontender, no edema, normal ROM Neurologic: Alert and oriented x 4.  GU: Neg  CVAT.  PELVIC EXAM: Deferred Pad count, scant light red bleeding on pad worn in MAU 1+ hour    LAB RESULTS Results for orders placed or performed during the hospital encounter of 09/27/20 (from the past 24 hour(s))  hCG, quantitative, pregnancy     Status: Abnormal   Collection Time: 09/27/20 12:44 PM  Result Value Ref Range   hCG, Beta Chain, Quant, S 19,814 (H) <5 mIU/mL    --/--/O POS (11/12 1418)  IMAGING US OB Transvaginal  Result Date: 09/27/2020 CLINICAL DATA:  Vaginal bleeding.  Positive pregnancy test. EXAM: OBSTETRIC <14 WK [name] AND TRANSVAGINAL OB US TECHNIQUE: Both transabdominal and transvaginal ultrasound examinations were performed for complete evaluation of the gestation as well as the maternal uterus, adnexal regions, and pelvic cul-de-sac. Transvaginal technique was performed to assess early pregnancy. COMPARISON:  None. FINDINGS: Intrauterine gestational sac: Single. Yolk sac:  Not visualized. Embryo:  Not visualized. Cardiac Activity: N/A MSD: 9.4 mm   5 w   5 d Subchorionic hemorrhage:  None visualized. Maternal uterus/adnexae: Maternal ovaries unremarkable. IMPRESSION: Single intrauterine gestational sac identified without visible yolk sac or embryo. Estimated gestational age by mean sac diameter is 5 weeks 5 days. Electronically Signed   By: Kennith Center M.D.   On: 09/27/2020 14:15   US OB LESS THAN 14 WEEKS WITH OB TRANSVAGINAL  Result Date: 09/20/2020 CLINICAL DATA:  Cramping, vaginal bleeding EXAM: OBSTETRIC <14 WK [name] AND TRANSVAGINAL OB US TECHNIQUE: Both transabdominal and transvaginal ultrasound examinations were performed for complete evaluation of the gestation as well as the maternal uterus, adnexal regions, and pelvic cul-de-sac. Transvaginal technique was performed to assess early pregnancy. COMPARISON:  None. FINDINGS: Intrauterine gestational sac: None Yolk sac:  Not Visualized. Embryo:  Not Visualized. Cardiac Activity: Not Visualized. Maternal  uterus/adnexae: Left ovary measures 2.8 x 2.5 x 2.6 cm, with possible corpus luteum cyst measuring 1.2 cm. The right ovary measures 3.0 x 1.6 x 2.2 cm. Endometrium measures 14 mm in thickness. No evidence of intrauterine pregnancy. No free fluid. IMPRESSION: 1. No evidence of intrauterine pregnancy at this time. Serial beta HCG measurements and follow-up ultrasound may be useful. 2. Probable corpus luteum cyst left ovary. Electronically Signed   By: Sharlet Salina M.D.   On: 09/20/2020 14:58    MAU Management/MDM: Orders Placed This Encounter  Procedures  . US OB Transvaginal  . US OB Transvaginal  . hCG, quantitative, pregnancy  . Discharge patient    No orders of the defined types were placed in this encounter.   Findings today could represent a normal early pregnancy, spontaneous abortion or ectopic pregnancy which can be life-threatening.  Ectopic precautions were given to the patient with plan to return in 48 hours for repeat quant hcg to evaluate pregnancy development. Given hcg level today and large amount of bleeding, most likely failed pregnancy but will follow hcgs and repeat US as needed.    ASSESSMENT 1. Vaginal bleeding in pregnancy, first trimester  2. Blood type, Rh positive   3. Pregnancy, location unknown     PLAN Discharge home Allergies as of 09/27/2020      Reactions   Sulfa Antibiotics Other (See Comments)   Unsure of reaction mother told her she was alergic      Medication List    STOP taking these medications   ibuprofen 600 MG tablet Commonly known as: ADVIL   ondansetron 4 MG tablet Commonly known as: ZOFRAN     TAKE these medications   acetaminophen 500 MG tablet Commonly known as: TYLENOL Take 1 tablet (500 mg total) by mouth every 6 (six) hours as needed.   dicyclomine 20 MG tablet Commonly known as: BENTYL Take 1 tablet (20 mg total) by mouth 2 (two) times daily.       Follow-up Information    [facility]  Follow up.   Specialty: Radiology Why: The office will call you to schedule a follow up ultrasound in 1 week. Return to MAU as needed for emergencies.  Contact information: [phone] [place] [phone]              Sharen Counter Certified Nurse-Midwife 09/28/2020  12:07 PM

## 2020-09-30 ENCOUNTER — Telehealth: Payer: Self-pay | Admitting: Advanced Practice Midwife

## 2020-09-30 ENCOUNTER — Telehealth: Payer: Self-pay | Admitting: Certified Nurse Midwife

## 2020-09-30 ENCOUNTER — Other Ambulatory Visit: Payer: Self-pay

## 2020-09-30 ENCOUNTER — Inpatient Hospital Stay (HOSPITAL_COMMUNITY)
Admission: AD | Admit: 2020-09-30 | Discharge: 2020-09-30 | Disposition: A | Discharge status: Home / Self Care | Payer: Medicaid Other | Attending: Obstetrics & Gynecology | Admitting: Obstetrics & Gynecology

## 2020-09-30 DIAGNOSIS — O3680X0 Pregnancy with inconclusive fetal viability, not applicable or unspecified: Secondary | ICD-10-CM | POA: Diagnosis present

## 2020-09-30 DIAGNOSIS — Z3A09 9 weeks gestation of pregnancy: Secondary | ICD-10-CM | POA: Diagnosis not present

## 2020-09-30 DIAGNOSIS — Z3A00 Weeks of gestation of pregnancy not specified: Secondary | ICD-10-CM

## 2020-09-30 LAB — HCG, QUANTITATIVE, PREGNANCY: hCG, Beta Chain, Quant, S: 41083 m[IU]/mL — ABNORMAL HIGH (ref <5)

## 2020-09-30 NOTE — Telephone Encounter (Signed)
Patient returned voicemail from [place]. Pt states she was unaware of repeat stat Quant hCG. She verbalized her intention to come to MAU this evening for repeat blood work.   Clayton Bibles, MSN, CNM Certified Nurse Midwife, [employer] for [employer], [facility] 09/30/20 1:08 PM

## 2020-09-30 NOTE — Progress Notes (Addendum)
Pt left unit prior to discharge instructions being reviewed & given.  MSE completed by S. Weinhold, CNM prior to pt departure.  POC & F/U discussed.

## 2020-09-30 NOTE — MAU Provider Note (Signed)
°  S [name] is a 24 y.o. G1P0 patient who presents to MAU for repeat Quant hCG. Patient states her abdominal pain has improved since she was evaluated in MAU on 09/27/2020, She also states her heavy bleeding has resolved.  Patient is planning elective pregnancy termination but has been told by her clinic that she cannot proceed until IUP is confirmed. Patient and her support person have multiple questions regarding ectopic workup over time, overall plan of care, and timing of her repeat ultrasound, which is currently scheduled for 11/29.  O BP 110/73 (BP Location: Right Arm)    Pulse 72    Temp 98.2 F (36.8 C) (Oral)    Resp 20    Ht 5' (1.524 m)    Wt 54.5 kg    LMP 08/08/2020    SpO2 100%    BMI 23.46 kg/m    Physical Exam Vitals and nursing note reviewed. Exam conducted with a chaperone present.  Constitutional:      General: She is not in acute distress.    Appearance: Normal appearance. She is not ill-appearing.  Cardiovascular:     Rate and Rhythm: Normal rate.     Pulses: Normal pulses.  Pulmonary:     Effort: Pulmonary effort is normal.  Skin:    General: Skin is warm and dry.     Capillary Refill: Capillary refill takes less than 2 seconds.  Neurological:     General: No focal deficit present.     Mental Status: She is alert.  Psychiatric:        Mood and Affect: Mood normal.        Behavior: Behavior normal.        Thought Content: Thought content normal.        Judgment: Judgment normal.    A Medical screening exam complete Appropriate rise in Quant hCG (16,109 on 11/19)  Results for orders placed or performed during the hospital encounter of 09/30/20 (from the past 24 hour(s))  hCG, quantitative, pregnancy     Status: Abnormal   Collection Time: 09/30/20  6:30 PM  Result Value Ref Range   hCG, Beta Chain, Quant, S 41,083 (H) <5 mIU/mL   P Discharge from MAU in stable condition Warning signs for worsening condition that would warrant emergency  follow-up discussed Patient may return to MAU as needed   Clayton Bibles, [facility] 09/30/2020 7:49 PM

## 2020-09-30 NOTE — Discharge Instructions (Signed)
Ectopic Pregnancy ° °An ectopic pregnancy happens when a fertilized egg grows outside the womb (uterus). The fertilized egg cannot stay alive outside of the womb. This problem often happens in a fallopian tube. It is often caused by damage to the tube. °If this problem is found early, you may be treated with medicine that stops the egg from growing. If your tube tears or bursts open (ruptures), you will bleed inside. Often, there is very bad pain in the lower belly. This is an emergency. You will need surgery. Get help right away. °Follow these instructions at home: °After being treated with medicine or surgery: °· Rest and limit your activity for as long as told by your doctor. °· Until your doctor says that it is safe: °? Do not lift anything that is heavier than 10 lb (4.5 kg) or the limit that your doctor tells you. °? Avoid exercise and any movement that takes a lot of effort. °· To prevent problems when pooping (constipation): °? Eat a healthy diet. This includes: °§ Fruits. °§ Vegetables. °§ Whole grains. °? Drink 6-8 glasses of water a day. °Contact a doctor if: °Get help right away if: °· You have sudden and very bad pain in your belly. °· You have very bad pain in your shoulders or neck. °· You have pain that gets worse and is not helped by medicine. °· You have: °? A fever or chills. °? Vaginal bleeding. °? Redness or swelling at the site of a surgical cut (incision). °· You feel sick to your stomach (nauseous) or you throw up (vomit). °· You feel dizzy or weak. °· You feel light-headed or you pass out (faint). °Summary °· An ectopic pregnancy happens when a fertilized egg grows outside the womb (uterus). °· If this problem is found early, you may be treated with medicine that stops the egg from growing. °· If your tube tears or bursts open (ruptures), you will need surgery. This is an emergency. Get help right away. °This information is not intended to replace advice given to you by your health care  provider. Make sure you discuss any questions you have with your health care provider. °Document Revised: 10/08/2017 Document Reviewed: 11/19/2016 °Elsevier Patient Education © 2020 [employer] Inc. ° °

## 2020-09-30 NOTE — Telephone Encounter (Signed)
Pt was to return to MAU for qhcg on 11/21 but did not show. Message left to call back. Needs to return to MAU for stat qhcg.

## 2020-09-30 NOTE — MAU Note (Signed)
Presents for HCG level.  Reports spotting with mild cramping.

## 2020-10-07 ENCOUNTER — Telehealth: Payer: Self-pay | Admitting: Medical

## 2020-10-07 ENCOUNTER — Ambulatory Visit
Admission: RE | Admit: 2020-10-07 | Discharge: 2020-10-07 | Disposition: A | Discharge status: Home / Self Care | Payer: Medicaid Other | Source: Ambulatory Visit | Attending: Obstetrics and Gynecology | Admitting: Obstetrics and Gynecology

## 2020-10-07 ENCOUNTER — Other Ambulatory Visit: Payer: Self-pay

## 2020-10-07 DIAGNOSIS — O3680X0 Pregnancy with inconclusive fetal viability, not applicable or unspecified: Secondary | ICD-10-CM | POA: Insufficient documentation

## 2020-10-07 NOTE — Telephone Encounter (Signed)
LM for patient to return call for [name] results from 10/07/2020  Vonzella Nipple, PA-C 10/07/2020 11:15 AM

## 2020-10-07 NOTE — Telephone Encounter (Signed)
Julie Bowman return call today at 12:46 PM and I confirmed patient's identity using two patient identifiers. [name] results from earlier today were reviewed. Patient is not scheduled for new OB visit a this time. She is unsure is she will continue the pregnancy yet. Will call back to schedule if needed. Patient voiced understanding and had no further questions.   US OB Transvaginal  Result Date: 10/07/2020 CLINICAL DATA:  24 year old pregnant female presents for assessment of pregnancy location and viability. No acute symptoms reported. Quantitative beta HCG [id number] on 09/27/2020. EDC by LMP: 05/15/2021, projecting to an expected gestational age of [redacted] weeks 4 days. EXAM: TRANSVAGINAL OB ULTRASOUND TECHNIQUE: Transvaginal ultrasound was performed for complete evaluation of the gestation as well as the maternal uterus, adnexal regions, and pelvic cul-de-sac. COMPARISON:  09/27/2020 obstetric scan. FINDINGS: Intrauterine gestational sac: Single Yolk sac:  Visualized. Embryo:  Visualized. Cardiac Activity: Visualized. Heart Rate: 129 bpm CRL:   6.1 mm   6 w 3 d                  [name] EDC: 05/30/2021 Subchorionic hemorrhage:  None visualized. Maternal uterus/adnexae: Right ovary measures 3.5 x 2.2 x 2.6 cm. Left ovary measures 4.1 x 2.4 x 3.4 cm and contains a corpus luteum. No suspicious ovarian or adnexal masses. IMPRESSION: Single living intrauterine gestation at 6 weeks 3 days by crown-rump length, discordant with the expected gestational age of [redacted] weeks 4 days by provided menstrual dating. No acute gestational abnormality. Consider follow-up obstetric scan in 4 weeks to assess fetal growth, as clinically warranted. Electronically Signed   By: Delbert Phenix M.D.   On: 10/07/2020 09:07    Vonzella Nipple, PA-C 10/07/2020 12:46 PM

## 2022-01-01 IMAGING — US US OB < 14 WEEKS - US OB TV
1 series · 15 of 28 positions shown · non-contrast
Comparison: None.

CLINICAL DATA: Cramping, vaginal bleeding

EXAM:
OBSTETRIC <14 WK US AND TRANSVAGINAL OB US
TECHNIQUE: Both transabdominal and transvaginal ultrasound examinations were
performed for complete evaluation of the gestation as well as the
maternal uterus, adnexal regions, and pelvic cul-de-sac.
Transvaginal technique was performed to assess early pregnancy.

[Series 1: us ob < 14 weeks - us ob tv · 15 of 52 slices shown]
[im 1/52]
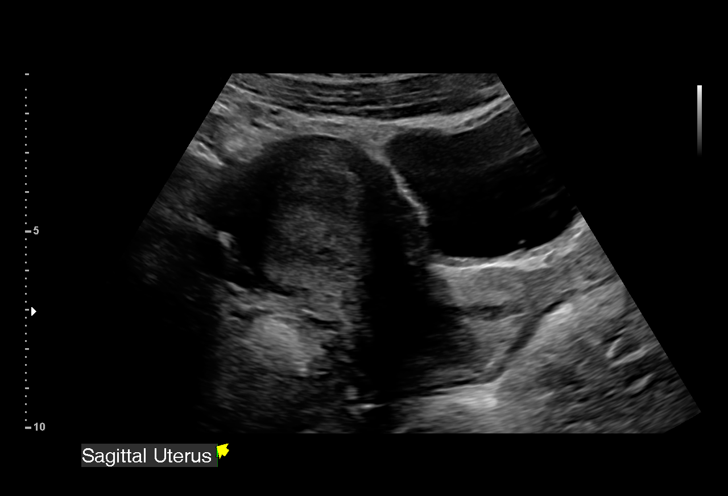
[im 4/52]
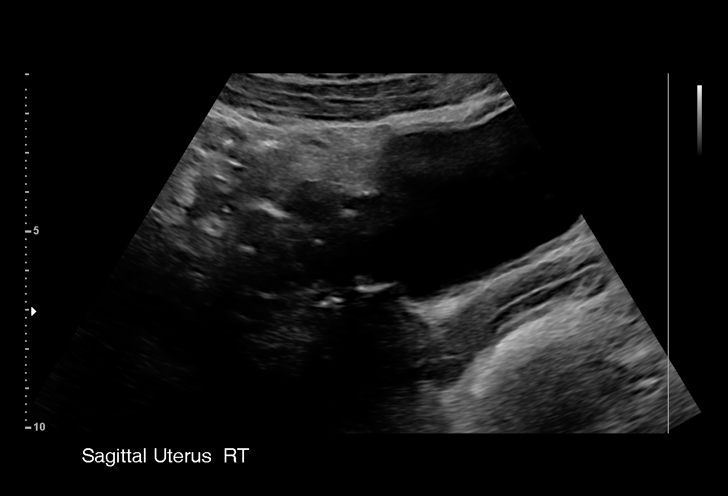
[im 8/52]
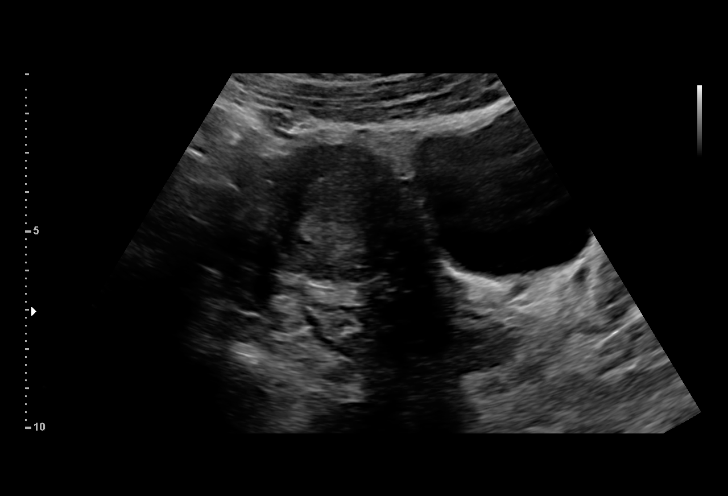
[im 12/52]
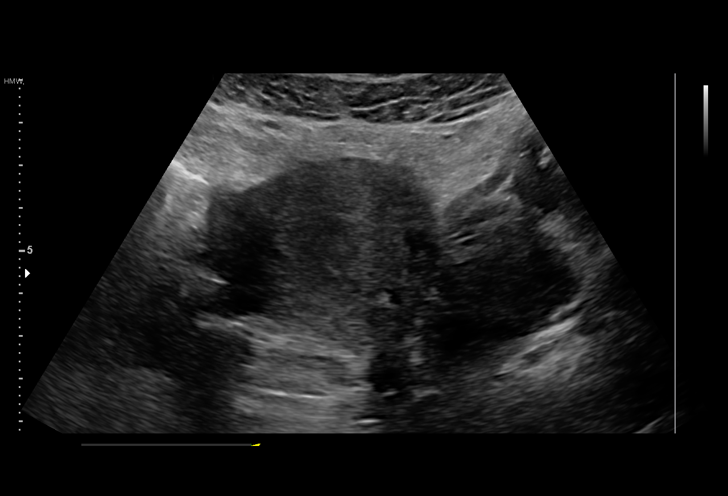
[im 16/52]
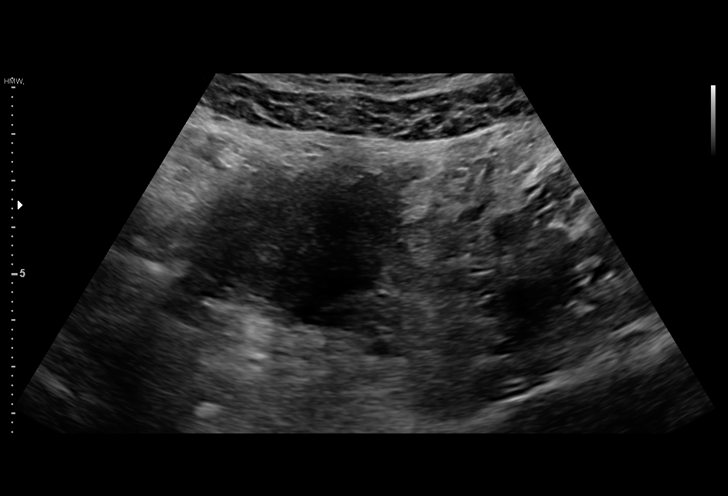
[im 19/52]
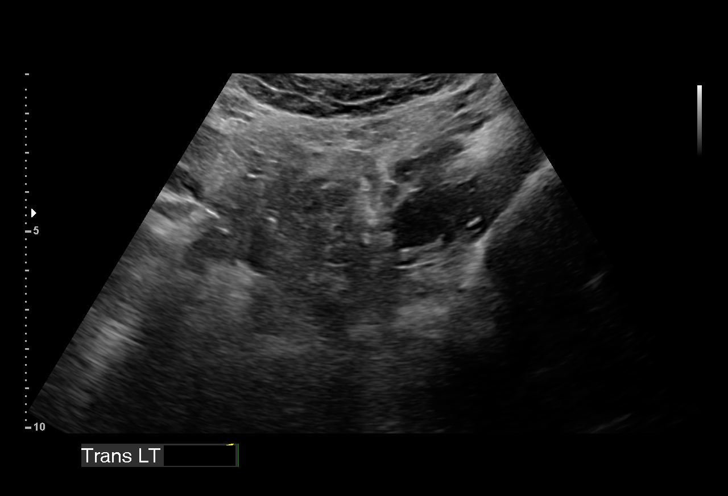
[im 23/52]
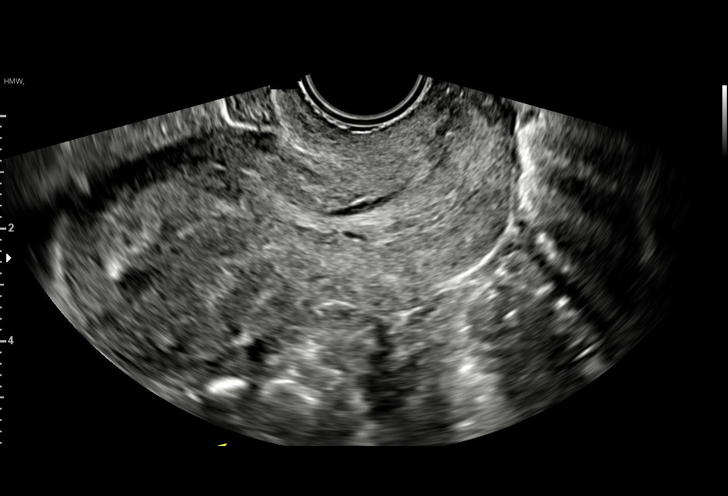
[im 27/52]
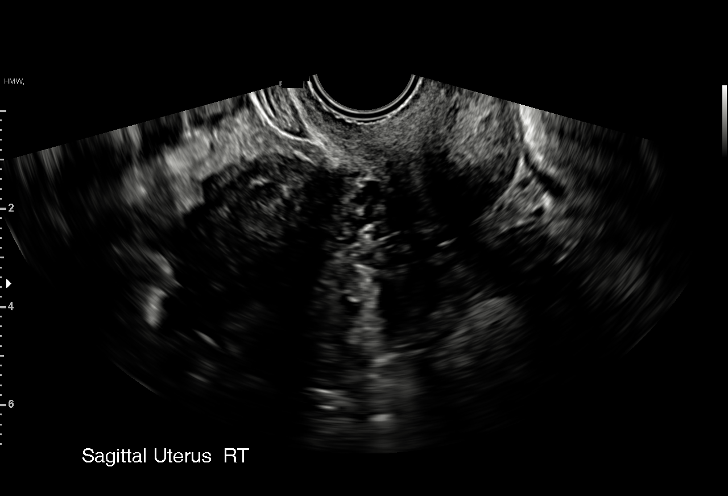
[im 29/52]
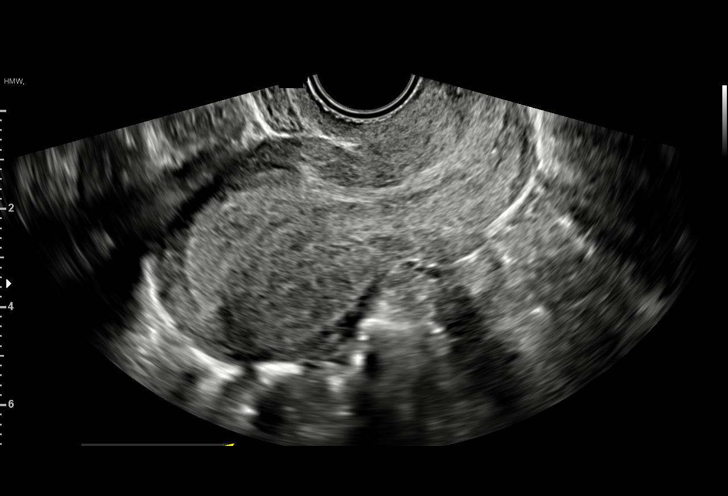
[im 33/52]
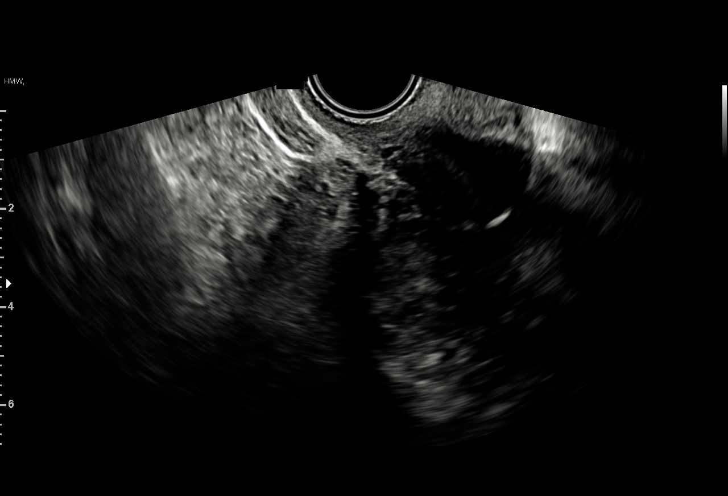
[im 36/52]
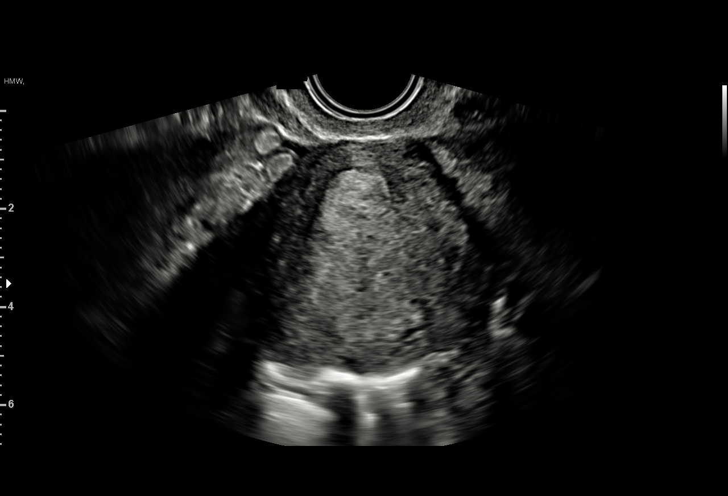
[im 40/52]
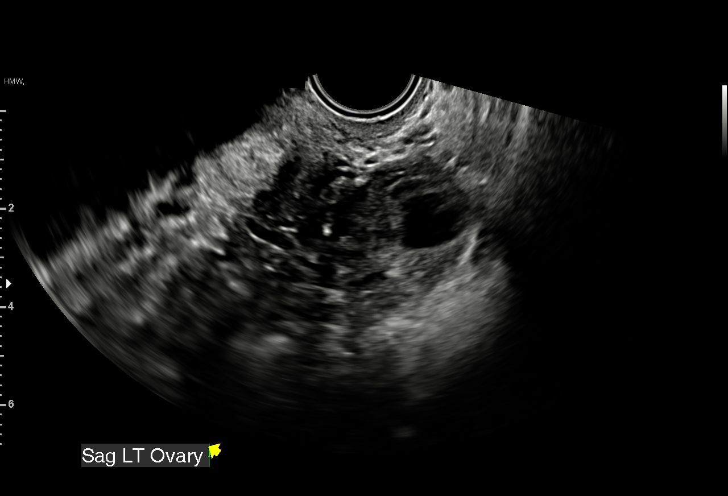
[im 44/52]
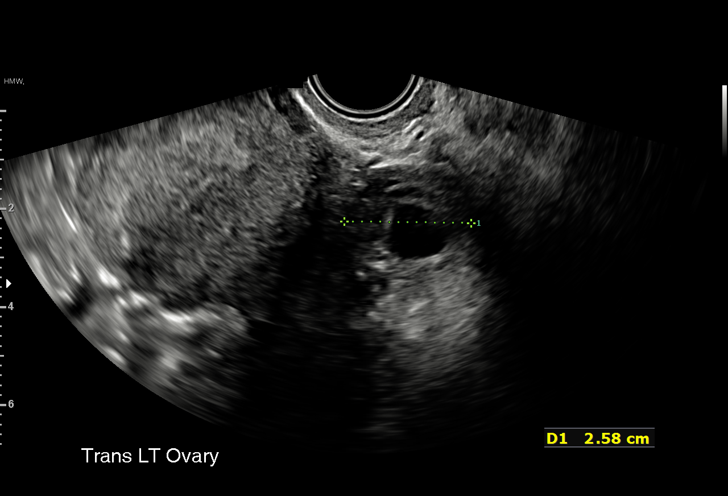
[im 48/52]
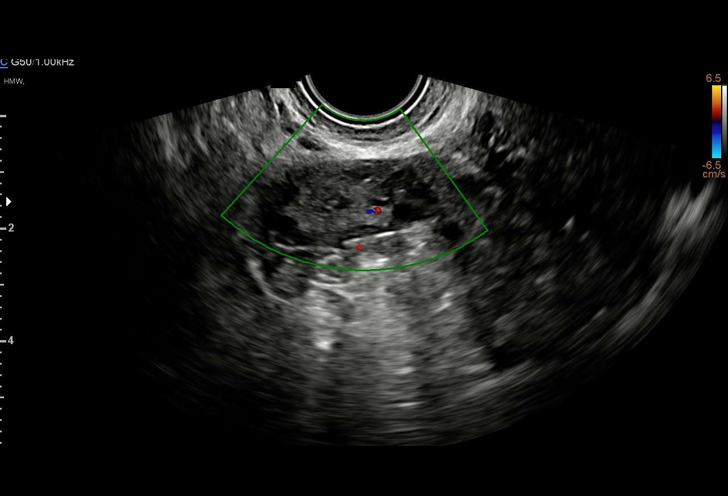
[im 52/52]
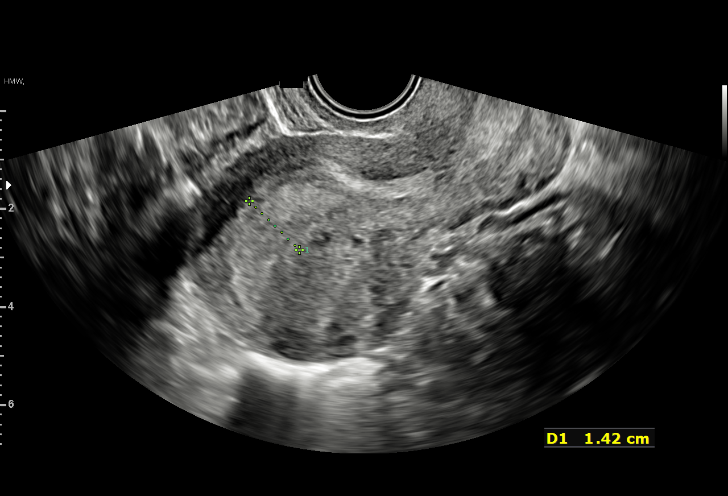

[15 of 28 positions shown; findings below may reference images not displayed]

FINDINGS: Intrauterine gestational sac: None

Yolk sac:  Not Visualized.

Embryo:  Not Visualized.

Cardiac Activity: Not Visualized.

Maternal uterus/adnexae: Left ovary measures 2.8 x 2.5 x 2.6 cm,
with possible corpus luteum cyst measuring 1.2 cm. The right ovary
measures 3.0 x 1.6 x 2.2 cm.

Endometrium measures 14 mm in thickness. No evidence of intrauterine
pregnancy.

No free fluid.
IMPRESSION: 1. No evidence of intrauterine pregnancy at this time. Serial beta
HCG measurements and follow-up ultrasound may be useful.
2. Probable corpus luteum cyst left ovary.

## 2022-01-08 IMAGING — US US OB TRANSVAGINAL
1 series · 15 of 28 positions shown · non-contrast
Comparison: None.

CLINICAL DATA: Vaginal bleeding.  Positive pregnancy test.

EXAM:
OBSTETRIC <14 WK US AND TRANSVAGINAL OB US
TECHNIQUE: Both transabdominal and transvaginal ultrasound examinations were
performed for complete evaluation of the gestation as well as the
maternal uterus, adnexal regions, and pelvic cul-de-sac.
Transvaginal technique was performed to assess early pregnancy.

[Series 1: us ob transvaginal · 15 of 33 slices shown]
[im 1/33]
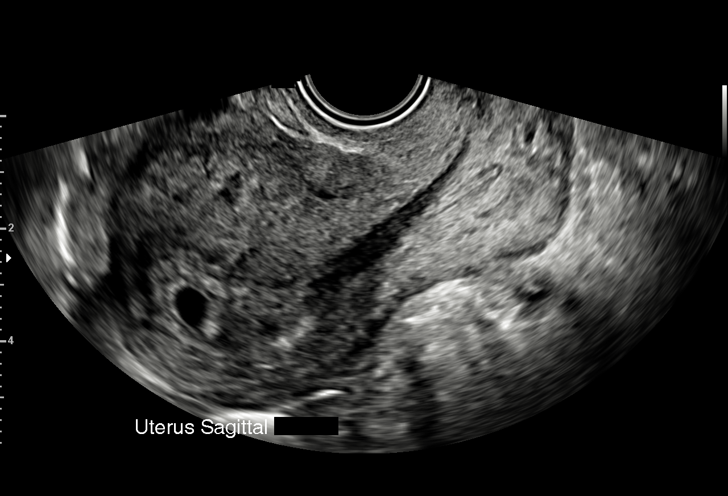
[im 3/33]
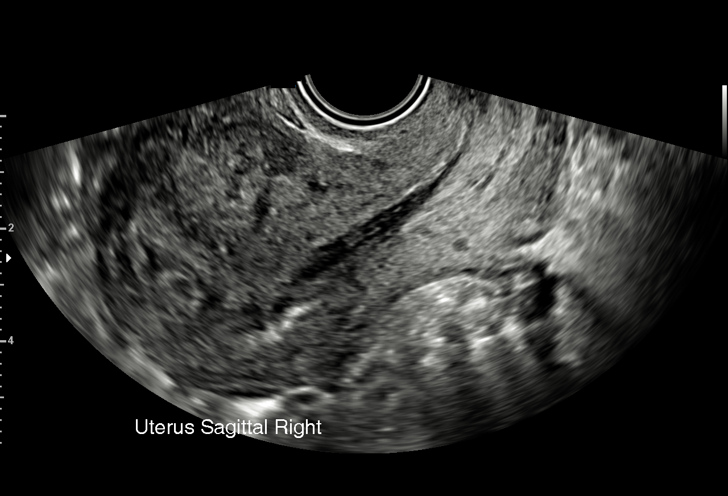
[im 5/33]
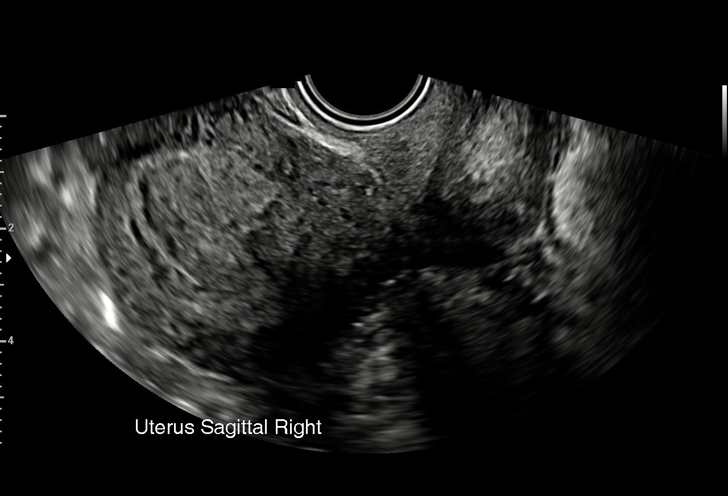
[im 8/33]
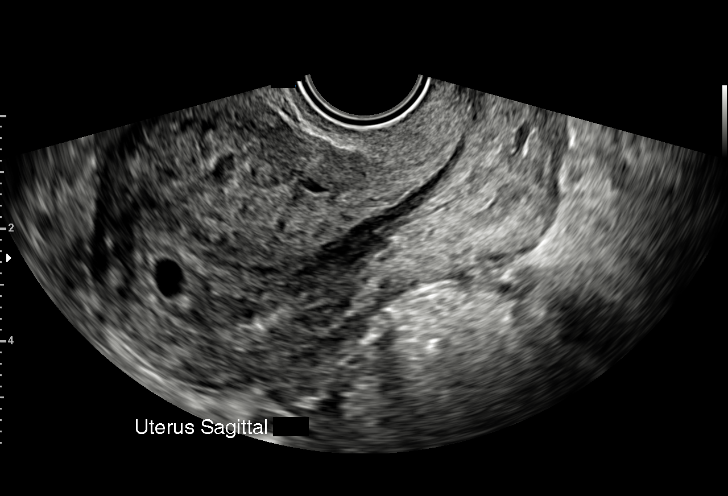
[im 10/33]
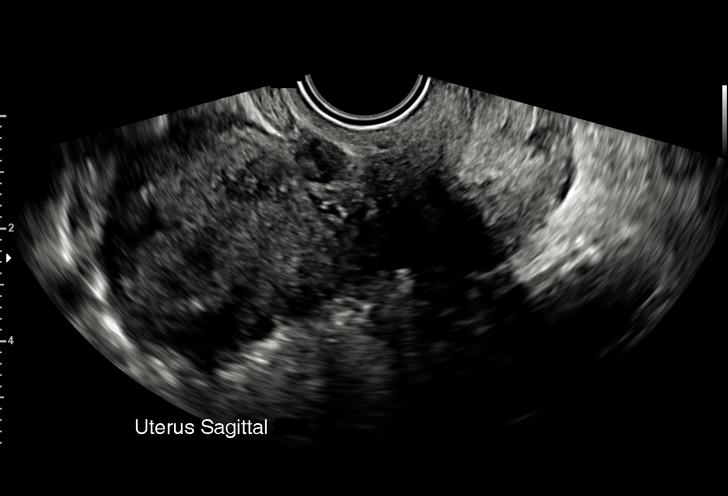
[im 12/33]
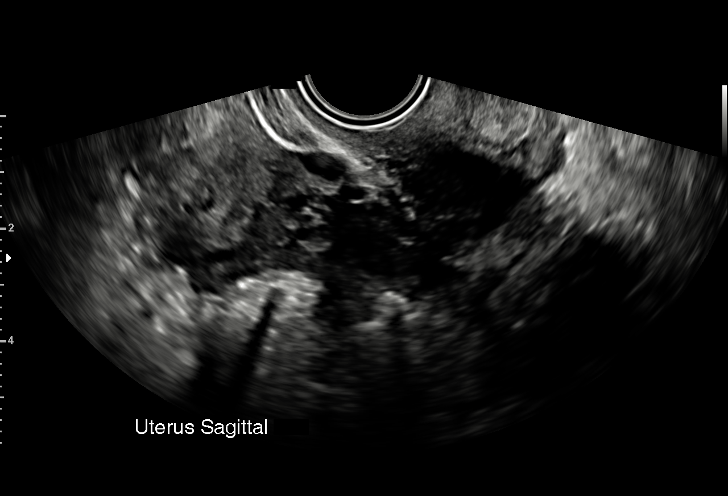
[im 15/33]
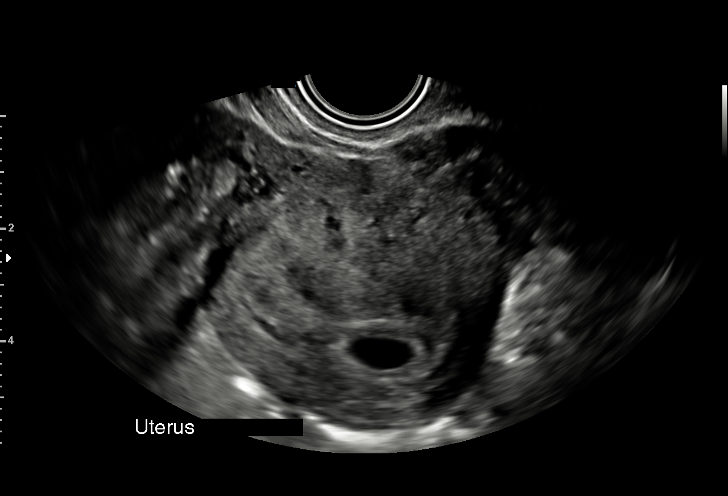
[im 17/33]
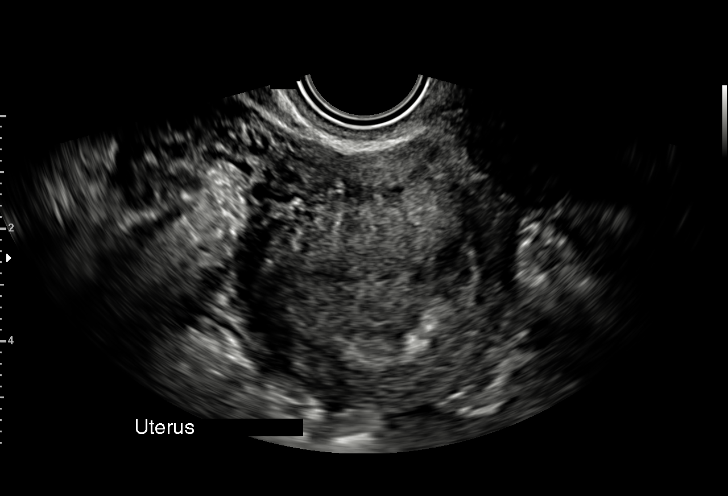
[im 18/33]
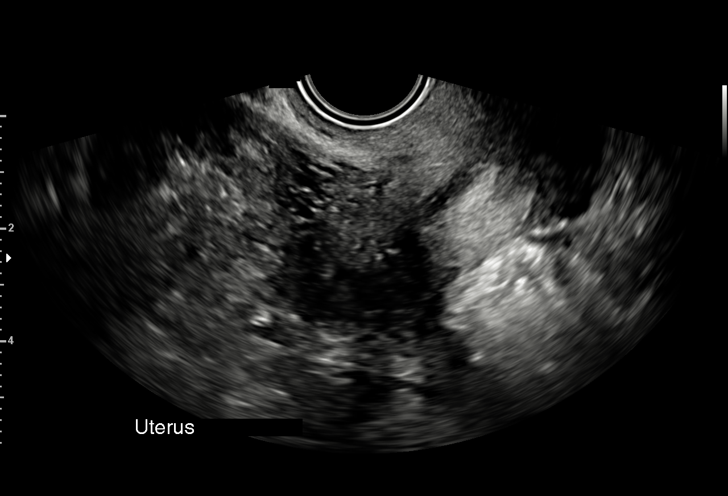
[im 21/33]
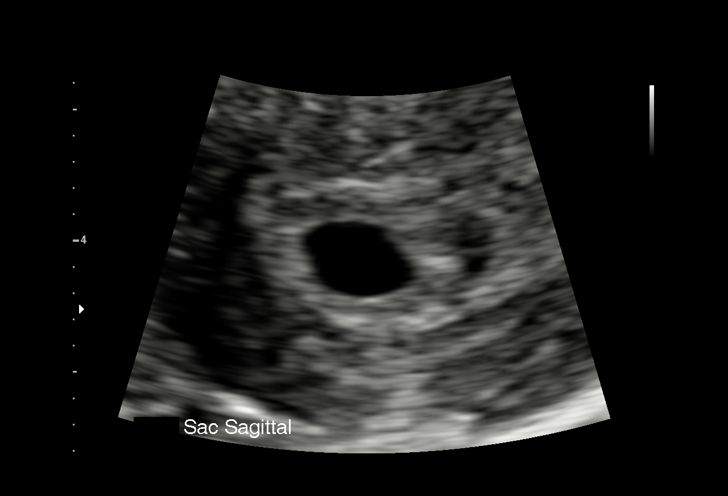
[im 23/33]
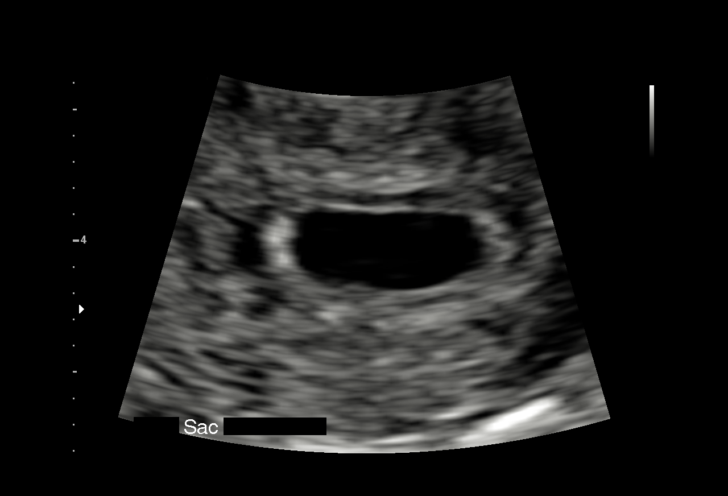
[im 25/33]
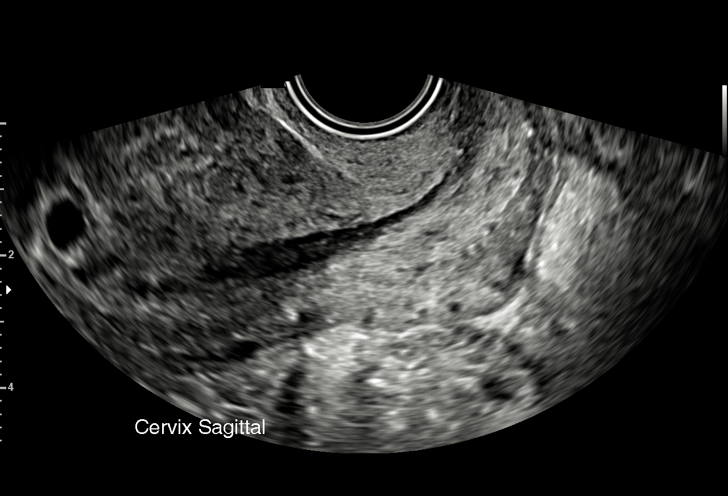
[im 28/33]
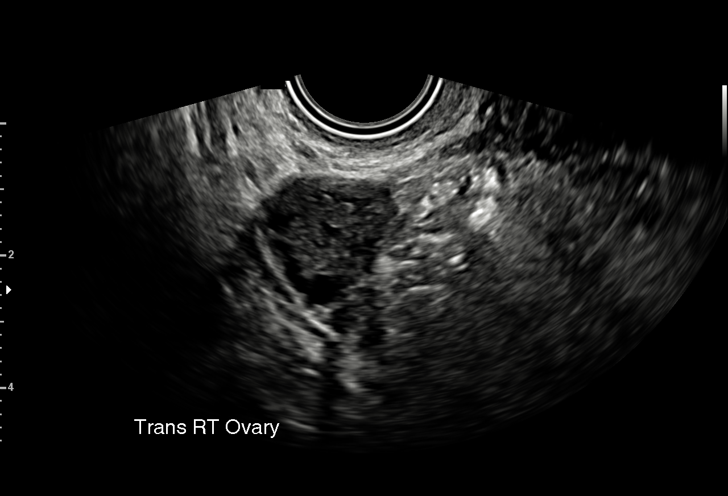
[im 30/33]
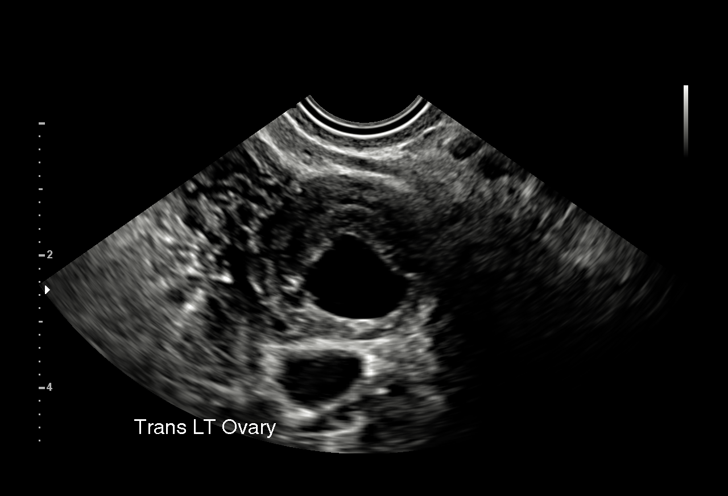
[im 33/33]
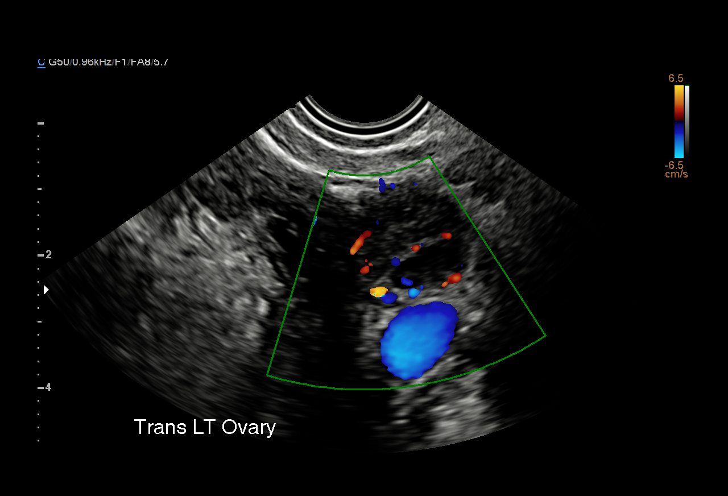

[15 of 28 positions shown; findings below may reference images not displayed]

FINDINGS: Intrauterine gestational sac: Single.

Yolk sac:  Not visualized.

Embryo:  Not visualized.

Cardiac Activity: N/A

MSD: 9.4 mm   5 w   5 d

Subchorionic hemorrhage:  None visualized.

Maternal uterus/adnexae: Maternal ovaries unremarkable.
IMPRESSION: Single intrauterine gestational sac identified without visible yolk
sac or embryo. Estimated gestational age by mean sac diameter is 5
weeks 5 days.

## 2022-01-18 IMAGING — US US OB TRANSVAGINAL
1 series · 15 of 28 positions shown · non-contrast
Comparison: 09/27/2020 obstetric scan.

CLINICAL DATA: 24-year-old pregnant female presents for assessment
of pregnancy location and viability. No acute symptoms reported.
Quantitative beta HCG 19,814 on 09/27/2020.

EDC by LMP: 05/15/2021, projecting to an expected gestational age of
8 weeks 4 days.
EXAM:
TRANSVAGINAL OB ULTRASOUND
TECHNIQUE: Transvaginal ultrasound was performed for complete evaluation of the
gestation as well as the maternal uterus, adnexal regions, and
pelvic cul-de-sac.

[Series 1: us ob transvaginal · 50 acquisitions, 15 frames shown]
[im 1/50]
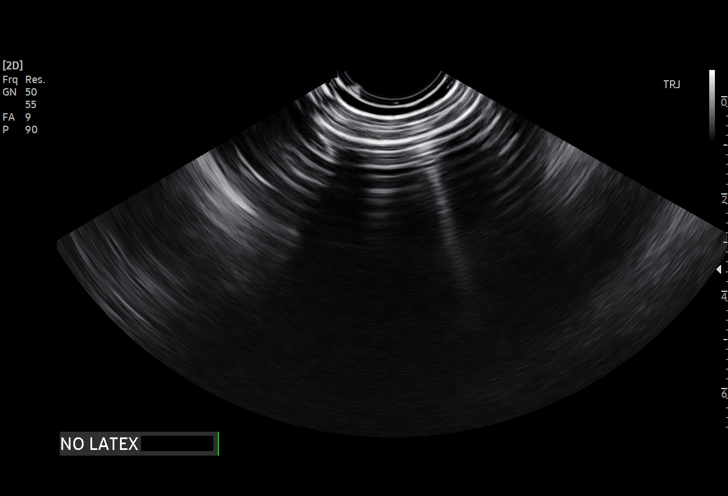
[im 4/50]
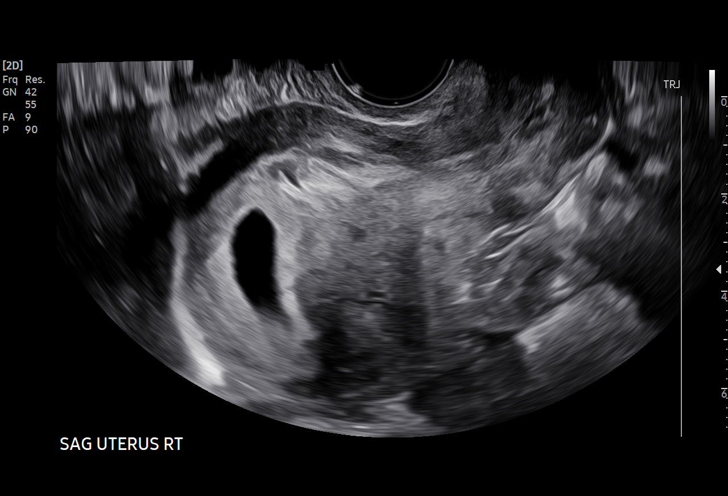
[im 8/50]
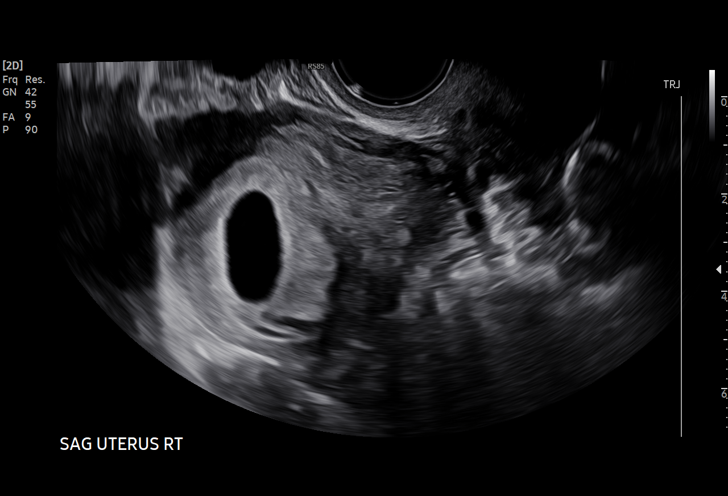
[im 11/50]
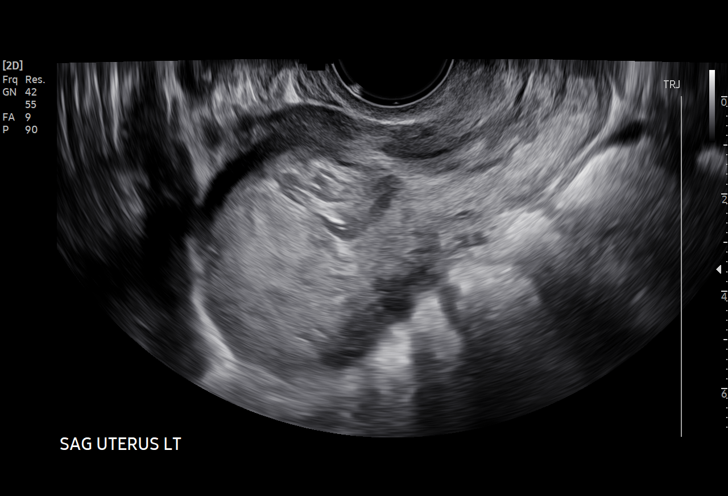
[im 15/50]
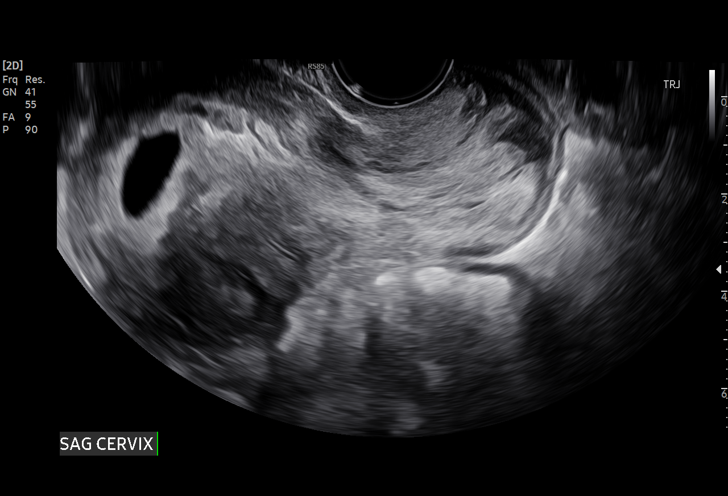
[im 19/50]
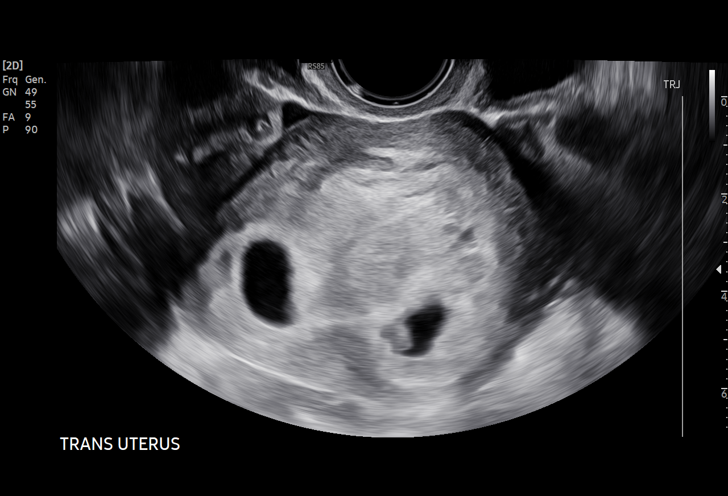
[im 22/50]
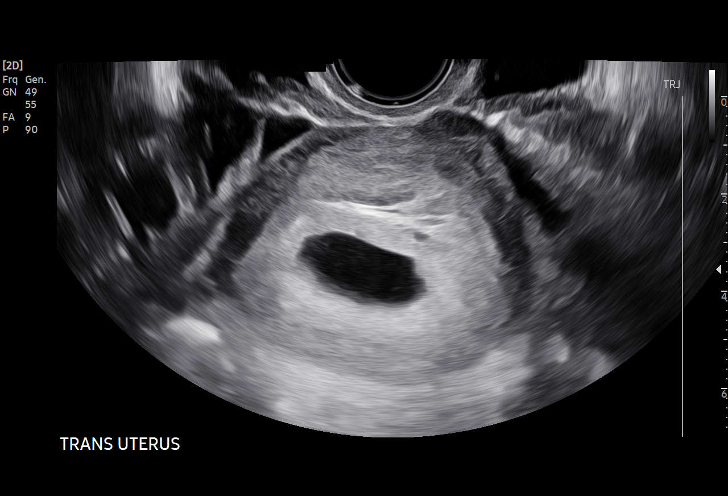
[im 26/50]
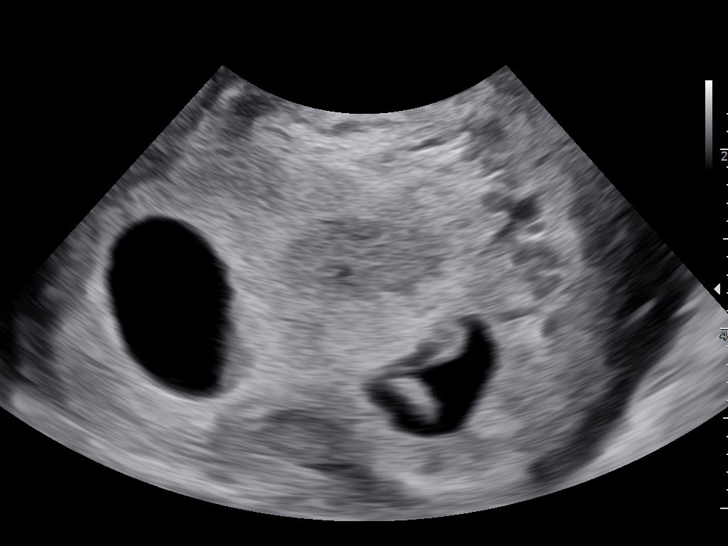
[im 28/50]
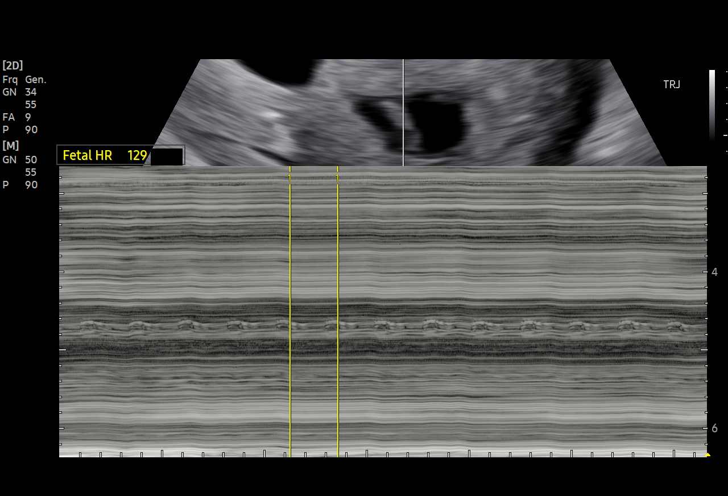
[im 31/50]
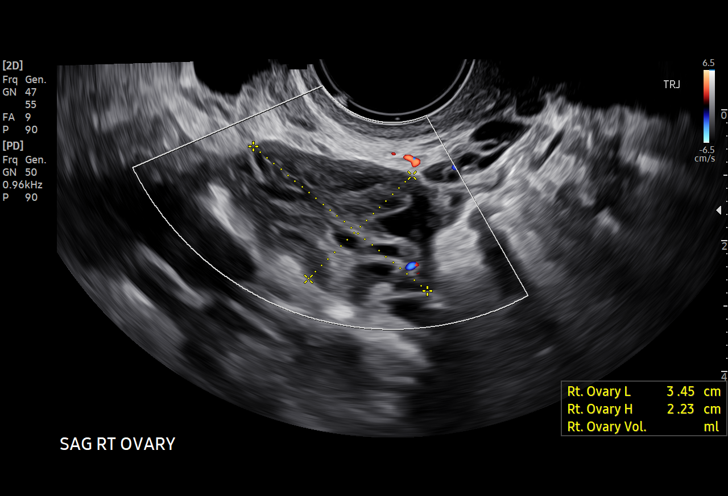
[im 35/50]
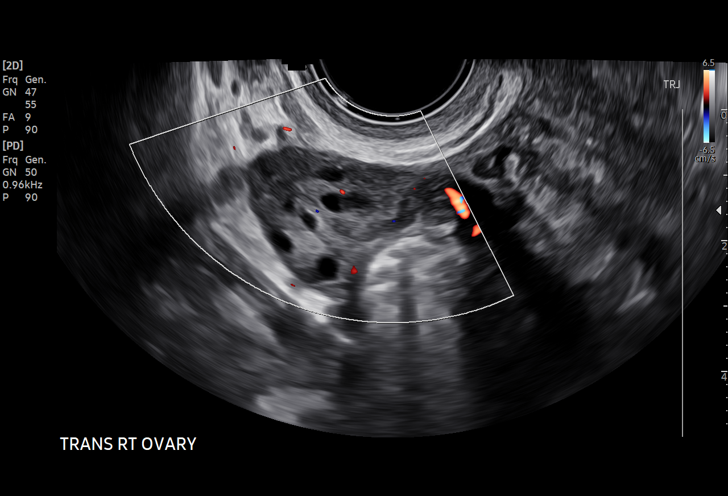
[im 39/50]
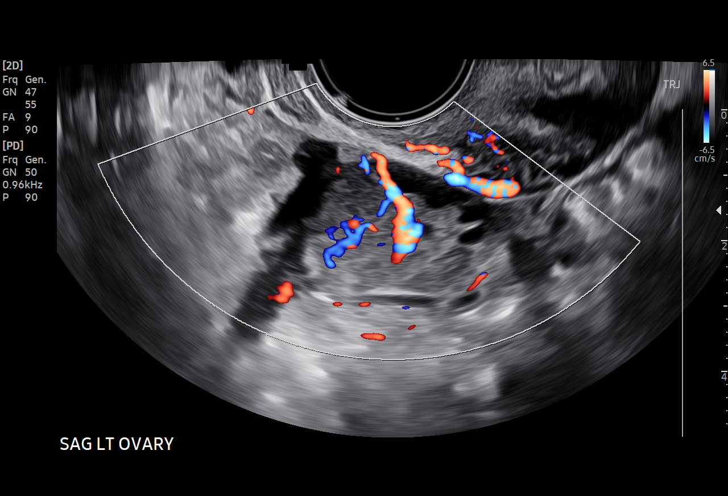
[im 42/50]
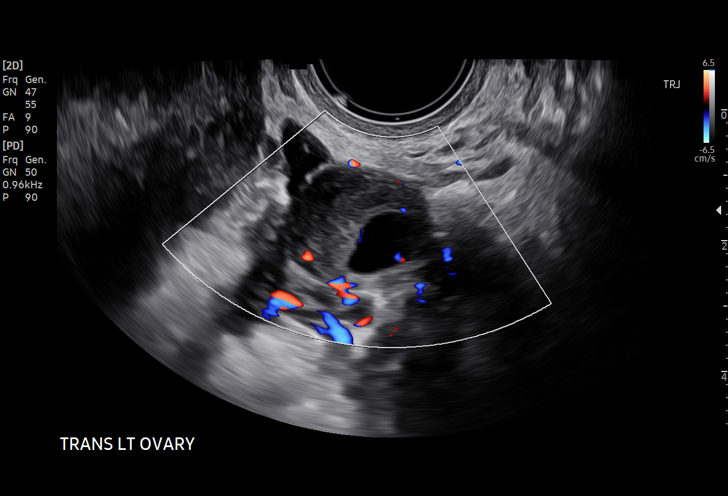
[im 46/50]
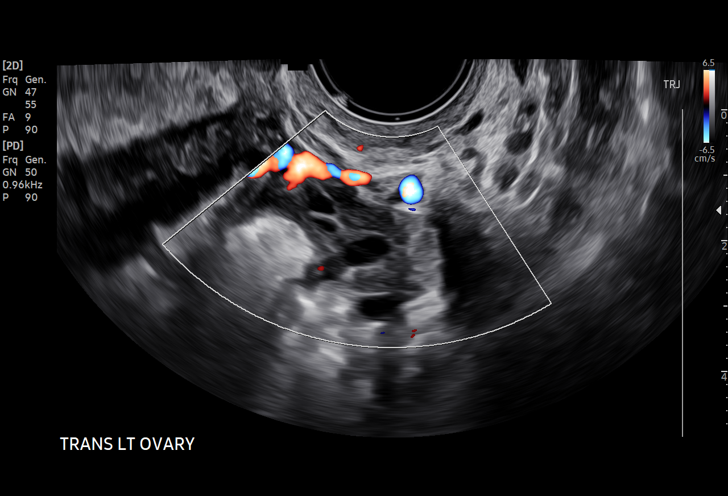
[im 50/50]
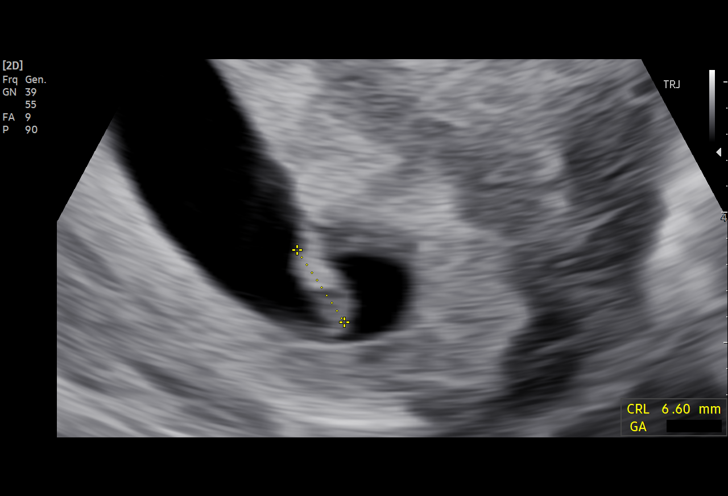

[15 of 28 positions shown; findings below may reference images not displayed]

FINDINGS: Intrauterine gestational sac: Single

Yolk sac:  Visualized.

Embryo:  Visualized.

Cardiac Activity: Visualized.

Heart Rate: 129 bpm

CRL:   6.1 mm   6 w 3 d                  US EDC: 05/30/2021

Subchorionic hemorrhage:  None visualized.

Maternal uterus/adnexae: Right ovary measures 3.5 x 2.2 x 2.6 cm.
Left ovary measures 4.1 x 2.4 x 3.4 cm and contains a corpus luteum.
No suspicious ovarian or adnexal masses.
IMPRESSION: Single living intrauterine gestation at 6 weeks 3 days by crown-rump
length, discordant with the expected gestational age of 8 weeks 4
days by provided menstrual dating. No acute gestational abnormality.
Consider follow-up obstetric scan in 4 weeks to assess fetal growth,
as clinically warranted.

## 2023-12-17 ENCOUNTER — Ambulatory Visit (INDEPENDENT_AMBULATORY_CARE_PROVIDER_SITE_OTHER): Payer: 59 | Admitting: Family Medicine

## 2023-12-17 ENCOUNTER — Ambulatory Visit: Payer: Self-pay | Admitting: Family Medicine

## 2023-12-17 ENCOUNTER — Encounter: Payer: Self-pay | Admitting: Family Medicine

## 2023-12-17 VITALS — BP 130/88 | HR 110 | Temp 97.8°F | Ht 60.0 in | Wt 126.0 lb

## 2023-12-17 DIAGNOSIS — R202 Paresthesia of skin: Secondary | ICD-10-CM | POA: Diagnosis not present

## 2023-12-17 DIAGNOSIS — Z124 Encounter for screening for malignant neoplasm of cervix: Secondary | ICD-10-CM

## 2023-12-17 DIAGNOSIS — R6889 Other general symptoms and signs: Secondary | ICD-10-CM

## 2023-12-17 DIAGNOSIS — R519 Headache, unspecified: Secondary | ICD-10-CM

## 2023-12-17 DIAGNOSIS — F419 Anxiety disorder, unspecified: Secondary | ICD-10-CM

## 2023-12-17 DIAGNOSIS — G4485 Primary stabbing headache: Secondary | ICD-10-CM

## 2023-12-17 DIAGNOSIS — Z803 Family history of malignant neoplasm of breast: Secondary | ICD-10-CM

## 2023-12-17 LAB — CBC WITH DIFFERENTIAL/PLATELET
Basophils Absolute: 0.1 10*3/uL (ref 0.0–0.1)
Basophils Relative: 0.8 % (ref 0.0–3.0)
Eosinophils Absolute: 0.3 10*3/uL (ref 0.0–0.7)
Eosinophils Relative: 4.5 % (ref 0.0–5.0)
HCT: 41.8 % (ref 36.0–46.0)
Hemoglobin: 14 g/dL (ref 12.0–15.0)
Lymphocytes Relative: 26.7 % (ref 12.0–46.0)
Lymphs Abs: 1.9 10*3/uL (ref 0.7–4.0)
MCHC: 33.6 g/dL (ref 30.0–36.0)
MCV: 95.6 fL (ref 78.0–100.0)
Monocytes Absolute: 0.5 10*3/uL (ref 0.1–1.0)
Monocytes Relative: 6.5 % (ref 3.0–12.0)
Neutro Abs: 4.3 10*3/uL (ref 1.4–7.7)
Neutrophils Relative %: 61.5 % (ref 43.0–77.0)
Platelets: 258 10*3/uL (ref 150.0–400.0)
RBC: 4.37 Mil/uL (ref 3.87–5.11)
RDW: 13.3 % (ref 11.5–15.5)
WBC: 6.9 10*3/uL (ref 4.0–10.5)

## 2023-12-17 LAB — COMPREHENSIVE METABOLIC PANEL
ALT: 14 U/L (ref 0–35)
AST: 20 U/L (ref 0–37)
Albumin: 5.1 g/dL (ref 3.5–5.2)
Alkaline Phosphatase: 43 U/L (ref 39–117)
BUN: 15 mg/dL (ref 6–23)
CO2: 26 meq/L (ref 19–32)
Calcium: 9.8 mg/dL (ref 8.4–10.5)
Chloride: 104 meq/L (ref 96–112)
Creatinine, Ser: 1.03 mg/dL (ref 0.40–1.20)
GFR: 74.54 mL/min (ref >60.00)
Glucose, Bld: 78 mg/dL (ref 70–99)
Potassium: 4.2 meq/L (ref 3.5–5.1)
Sodium: 141 meq/L (ref 135–145)
Total Bilirubin: 0.7 mg/dL (ref 0.2–1.2)
Total Protein: 7.8 g/dL (ref 6.0–8.3)

## 2023-12-17 LAB — T4, FREE: Free T4: 0.16 ng/dL — ABNORMAL LOW (ref 0.60–1.60)

## 2023-12-17 LAB — FERRITIN: Ferritin: 17.9 ng/mL (ref 10.0–291.0)

## 2023-12-17 LAB — TSH: TSH: 201.97 u[IU]/mL — ABNORMAL HIGH (ref 0.35–5.50)

## 2023-12-17 LAB — VITAMIN B12: Vitamin B-12: 632 pg/mL (ref 211–911)

## 2023-12-17 NOTE — Patient Instructions (Addendum)
 Thank you for trusting us  with your health care.  Please go downstairs for labs before you leave.  We will be in touch with your results and with recommendations.  I recommend scheduling with an ophthalmologist for a complete dilated eye exam due to your family history of intracranial hypertension   You will receive a call to have a CT of your brain    [email or web address]   [email or web address]   [facility] Multiple locations [phone]  ((Wanda Louder at [facility]))     [facility] [address] [place], [place] [address]  Phone: [phone]   [facility]  [address], [place], [place] [address]  Phone: [phone]

## 2023-12-17 NOTE — Progress Notes (Signed)
 New Patient Office Visit  Subjective    Patient ID: Julie Bowman, female    DOB: 03-Oct-1996  Age: 28 y.o. MRN: [id number]  CC:  Chief Complaint  Patient presents with   Establish Care    Getting pressure in her head when she looks up as well as icepick heaches. Hasn't happened in a while but went on last month for about a week.   Anxiety - when feeling anxious tongue gets tingling and can't think of words cat got your tongue    HPI Julie Bowman presents to establish care No PCP in 3 years.   Last saw OB/GYN 3 years ago.   C/o a 1 year hx of head pressure when she looks up sometimes    Tongue tingling, bilaterally at times.    1 month ago she had headaches, new issue.   Alcohol once weekly  Vapes  Periods are monthly    Dad had pancreatic cancer and died in his 55s  Mother breast cancer, HTN, HLD Mother had intracranial hypertension  No siblings   Dental assistance  Married  No kids           12/17/2023    1:49 PM 09/23/2020   11:20 AM  Depression screen PHQ 2/9  Decreased Interest 0 0  Down, Depressed, Hopeless 1 0  PHQ - 2 Score 1 0  Altered sleeping  1  Tired, decreased energy  1  Change in appetite  0  Feeling bad or failure about yourself   0  Trouble concentrating  0  Moving slowly or fidgety/restless  0  Suicidal thoughts  0  PHQ-9 Score  2       No outpatient encounter medications on file as of 12/17/2023.   No facility-administered encounter medications on file as of 12/17/2023.    Past Medical History:  Diagnosis Date   Medical history non-contributory     Past Surgical History:  Procedure Laterality Date   TONSILLECTOMY      Family History  Problem Relation Age of Onset   Cancer Mother     Social History   Socioeconomic History   Marital status: Single    Spouse name: Not on file   Number of children: Not on file   Years of education: Not on file   Highest education level: Not on file  Occupational History    Not on file  Tobacco Use   Smoking status: Former   Smokeless tobacco: Never  Vaping Use   Vaping status: Every Day  Substance and Sexual Activity   Alcohol use: Not Currently    Comment: occ   Drug use: Not Currently   Sexual activity: Yes    Birth control/protection: None  Other Topics Concern   Not on file  Social History Narrative   Not on file   Social Drivers of Health   Financial Resource Strain: Not on file  Food Insecurity: No Food Insecurity (09/23/2020)   Hunger Vital Sign    Worried About Running Out of Food in the Last Year: Never true    Ran Out of Food in the Last Year: Never true  Transportation Needs: No Transportation Needs (09/23/2020)   PRAPARE - Administrator, Civil Service (Medical): No    Lack of Transportation (Non-Medical): No  Physical Activity: Not on file  Stress: Not on file  Social Connections: Not on file  Intimate Partner Violence: Not on file    Review of Systems  Constitutional:  Negative  for chills, diaphoresis, fever and malaise/fatigue.  Respiratory:  Negative for shortness of breath.   Cardiovascular:  Negative for chest pain, palpitations and leg swelling.  Gastrointestinal:  Negative for abdominal pain, constipation, diarrhea, nausea and vomiting.  Genitourinary:  Negative for dysuria, frequency and urgency.  Musculoskeletal:  Negative for joint pain and myalgias.  Neurological:  Positive for tingling and headaches. Negative for dizziness and focal weakness.  Psychiatric/Behavioral:  Negative for depression. The patient is not nervous/anxious.         Objective    BP 130/88 (BP Location: Left Arm, Patient Position: Sitting, Cuff Size: Normal)   Pulse (!) 110   Temp 97.8 F (36.6 C) (Temporal)   Ht 5' (1.524 m)   Wt 126 lb (57.2 kg)   SpO2 99%   BMI 24.61 kg/m   Physical Exam Constitutional:      General: She is not in acute distress.    Appearance: She is not ill-appearing.  HENT:     Mouth/Throat:      Mouth: Mucous membranes are moist.  Eyes:     Extraocular Movements: Extraocular movements intact.     Conjunctiva/sclera: Conjunctivae normal.  Cardiovascular:     Rate and Rhythm: Normal rate and regular rhythm.  Pulmonary:     Effort: Pulmonary effort is normal.     Breath sounds: Normal breath sounds.  Musculoskeletal:     Cervical back: Normal range of motion and neck supple.  Skin:    General: Skin is warm and dry.  Neurological:     General: No focal deficit present.     Mental Status: She is alert and oriented to person, place, and time.     Motor: No weakness.     Coordination: Coordination normal.     Gait: Gait normal.  Psychiatric:        Mood and Affect: Mood normal.        Behavior: Behavior normal.        Thought Content: Thought content normal.         Assessment & Plan:   Problem List Items Addressed This Visit   None Visit Diagnoses       Paresthesias    -  Primary   Relevant Orders   CBC with Differential/Platelet (Completed)   Comprehensive metabolic panel (Completed)   TSH (Completed)   T4, free (Completed)   Vitamin B12 (Completed)   Ferritin (Completed)     Intolerance to cold       Relevant Orders   CBC with Differential/Platelet (Completed)   Comprehensive metabolic panel (Completed)   TSH (Completed)   T4, free (Completed)   Vitamin B12 (Completed)   Ferritin (Completed)     Pap smear for cervical cancer screening       Relevant Orders   Ambulatory referral to Obstetrics / Gynecology     Family history of breast cancer in mother       Relevant Orders   Ambulatory referral to Obstetrics / Gynecology     Anxiety         New onset headache       Relevant Orders   CT HEAD WO CONTRAST ( )     Primary stabbing headache       Relevant Orders   CT HEAD WO CONTRAST ( )      No red flag symptoms but concerning new onset headaches.  CT head w/o contrast ordered.  Check labs to look for underlying etiology for paresthesias and  cold intolerance.  Refer to OB/GYN for female health. Family hx of breast cancer.  Follow up pending lab results.   No follow-ups on file.   Boby Mackintosh, NP-C

## 2023-12-20 ENCOUNTER — Other Ambulatory Visit: Payer: Self-pay | Admitting: Family Medicine

## 2023-12-20 DIAGNOSIS — E039 Hypothyroidism, unspecified: Secondary | ICD-10-CM

## 2023-12-21 ENCOUNTER — Other Ambulatory Visit: Payer: Self-pay | Admitting: Family Medicine

## 2023-12-21 ENCOUNTER — Encounter: Payer: Self-pay | Admitting: Family Medicine

## 2023-12-21 DIAGNOSIS — E039 Hypothyroidism, unspecified: Secondary | ICD-10-CM

## 2023-12-21 DIAGNOSIS — G4485 Primary stabbing headache: Secondary | ICD-10-CM

## 2023-12-21 MED ORDER — LEVOTHYROXINE SODIUM 75 MCG PO TABS: 75.0000 ug | ORAL_TABLET | Freq: Every day | ORAL | 1 refills | Status: DC

## 2023-12-21 NOTE — Addendum Note (Signed)
Addended by: Marinus Maw on: 12/21/2023 08:59 AM   Modules accepted: Orders

## 2023-12-21 NOTE — Progress Notes (Signed)
 Her TSH is quite elevated and she has hypothyroidism. This has most likely been developing for a long time.  We need to start her on medication to lower this called levothyroxine . Take it first thing in the mornings on an empty stomach with only water. Do not eat or drink anything else for at least 30-45 minutes. I would like to change her head CT to an MRI. I will order this today. They will have to reach out to schedule the study.  I would like to see her back in 4 weeks or sooner if she has any questions.

## 2023-12-22 NOTE — Telephone Encounter (Signed)
Called pt and she would like to know if there is a specific diet she should be eating or avoiding with the hypothyroidism? Also wants to know if she should worry about her fertility?

## 2023-12-23 ENCOUNTER — Other Ambulatory Visit: Payer: Self-pay | Admitting: Family Medicine

## 2023-12-23 DIAGNOSIS — F418 Other specified anxiety disorders: Secondary | ICD-10-CM

## 2023-12-23 MED ORDER — ALPRAZOLAM 0.5 MG PO TABS: ORAL_TABLET | ORAL | 0 refills | Status: DC

## 2023-12-24 ENCOUNTER — Other Ambulatory Visit: Payer: Self-pay | Admitting: Family Medicine

## 2023-12-24 ENCOUNTER — Ambulatory Visit
Admission: RE | Admit: 2023-12-24 | Discharge: 2023-12-24 | Disposition: A | Discharge status: Home / Self Care | Payer: 59 | Source: Ambulatory Visit | Attending: Family Medicine | Admitting: Family Medicine

## 2023-12-24 ENCOUNTER — Other Ambulatory Visit: Payer: 59

## 2023-12-24 DIAGNOSIS — G4485 Primary stabbing headache: Secondary | ICD-10-CM

## 2023-12-24 DIAGNOSIS — E039 Hypothyroidism, unspecified: Secondary | ICD-10-CM

## 2023-12-26 ENCOUNTER — Encounter: Payer: Self-pay | Admitting: Family Medicine

## 2024-01-13 ENCOUNTER — Ambulatory Visit: Payer: 59 | Admitting: Family Medicine

## 2024-01-19 ENCOUNTER — Encounter: Payer: Self-pay | Admitting: Family Medicine

## 2024-01-19 ENCOUNTER — Ambulatory Visit (INDEPENDENT_AMBULATORY_CARE_PROVIDER_SITE_OTHER): Payer: 59 | Admitting: Family Medicine

## 2024-01-19 VITALS — BP 120/76 | HR 87 | Temp 97.8°F | Ht 60.0 in | Wt 122.0 lb

## 2024-01-19 DIAGNOSIS — F419 Anxiety disorder, unspecified: Secondary | ICD-10-CM

## 2024-01-19 DIAGNOSIS — E039 Hypothyroidism, unspecified: Secondary | ICD-10-CM | POA: Diagnosis not present

## 2024-01-19 LAB — TSH: TSH: 3.11 u[IU]/mL (ref 0.35–5.50)

## 2024-01-19 NOTE — Progress Notes (Signed)
 Subjective:     Patient ID: Julie Bowman, female    DOB: Oct 21, 1996, 28 y.o.   MRN: [id number]  Chief Complaint  Patient presents with   Medical Management of Chronic Issues    HPI  History of Present Illness         Here to follow up elevated TSH (201.97)and low free T4. Started on levothyroxine 75 mcg daily. Feeling better overall. Anxiety has improved.  Brain MRI reviewed with patient.   Family hx of thyroid disorder   States she had a migraine headache last week.  It resolved within an hour of laying down and taking ibuprofen   Periods are regular but heavy and painful some months  She has an OB/GYN appt scheduled.   She is hoping to schedule therapy. Referred to [employer].    Health Maintenance Due  Topic Date Due   HIV Screening  Never done   Hepatitis C Screening  Never done   DTaP/Tdap/Td (2 - Td or Tdap) 06/23/2017   Cervical Cancer Screening (Pap smear)  12/07/2023    Past Medical History:  Diagnosis Date   Medical history non-contributory     Past Surgical History:  Procedure Laterality Date   TONSILLECTOMY      Family History  Problem Relation Age of Onset   Cancer Mother     Social History   Socioeconomic History   Marital status: Single    Spouse name: Not on file   Number of children: Not on file   Years of education: Not on file   Highest education level: Not on file  Occupational History   Not on file  Tobacco Use   Smoking status: Former   Smokeless tobacco: Never  Vaping Use   Vaping status: Every Day  Substance and Sexual Activity   Alcohol use: Not Currently    Comment: occ   Drug use: Not Currently   Sexual activity: Yes    Birth control/protection: None  Other Topics Concern   Not on file  Social History Narrative   Not on file   Social Drivers of Health   Financial Resource Strain: Not on file  Food Insecurity: No Food Insecurity (09/23/2020)   Hunger Vital Sign    Worried About Running Out of Food  in the Last Year: Never true    Ran Out of Food in the Last Year: Never true  Transportation Needs: No Transportation Needs (09/23/2020)   PRAPARE - Administrator, Civil Service (Medical): No    Lack of Transportation (Non-Medical): No  Physical Activity: Not on file  Stress: Not on file  Social Connections: Not on file  Intimate Partner Violence: Not on file    Outpatient Medications Prior to Visit  Medication Sig Dispense Refill   levothyroxine (SYNTHROID) 75 MCG tablet Take 1 tablet (75 mcg total) by mouth daily before breakfast. 30 tablet 1   ALPRAZolam (XANAX) 0.5 MG tablet Take 1 tablet 30-45 minutes before MRI. (Patient not taking: Reported on 01/19/2024) 2 tablet 0   No facility-administered medications prior to visit.    Allergies  Allergen Reactions   Sulfa Antibiotics Other (See Comments)    Unsure of reaction mother told her she was alergic    Review of Systems  Constitutional:  Negative for chills, fever and malaise/fatigue.  Respiratory:  Negative for shortness of breath.   Cardiovascular:  Negative for chest pain, palpitations and leg swelling.  Gastrointestinal:  Negative for abdominal pain, constipation, diarrhea, nausea and  vomiting.  Genitourinary:  Negative for dysuria, frequency and urgency.  Neurological:  Positive for headaches. Negative for dizziness, tingling and focal weakness.  Psychiatric/Behavioral:  The patient is nervous/anxious.        Objective:    Physical Exam Constitutional:      General: She is not in acute distress.    Appearance: She is not ill-appearing.  HENT:     Mouth/Throat:     Mouth: Mucous membranes are moist.     Pharynx: Oropharynx is clear.  Eyes:     Extraocular Movements: Extraocular movements intact.     Conjunctiva/sclera: Conjunctivae normal.  Neck:     Thyroid: No thyroid mass, thyromegaly or thyroid tenderness.  Cardiovascular:     Rate and Rhythm: Normal rate and regular rhythm.  Pulmonary:      Effort: Pulmonary effort is normal.     Breath sounds: Normal breath sounds.  Musculoskeletal:     Cervical back: Normal range of motion and neck supple. No pain with movement. Normal range of motion.  Skin:    General: Skin is warm and dry.  Neurological:     General: No focal deficit present.     Mental Status: She is alert and oriented to person, place, and time.     Motor: No weakness.     Coordination: Coordination normal.     Gait: Gait normal.  Psychiatric:        Mood and Affect: Mood normal.        Behavior: Behavior normal.        Thought Content: Thought content normal.      BP 120/76 (BP Location: Left Arm, Patient Position: Sitting)   Pulse 87   Temp 97.8 F (36.6 C) (Temporal)   Ht 5' (1.524 m)   Wt 122 lb (55.3 kg)   SpO2 100%   BMI 23.83 kg/m  Wt Readings from Last 3 Encounters:  01/19/24 122 lb (55.3 kg)  12/17/23 126 lb (57.2 kg)  09/30/20 120 lb 1.6 oz (54.5 kg)       Assessment & Plan:   Problem List Items Addressed This Visit   None Visit Diagnoses       Hypothyroidism, unspecified type    -  Primary   Relevant Orders   TSH   Thyroid peroxidase antibody   Ambulatory referral to Endocrinology     Anxiety          She is here to follow-up on significantly elevated TSH and low free T4.  This is a new finding.  She does have a family history of thyroid disorder. Reports taking levothyroxine 75 mcg daily on empty stomach. Overall she is feeling better. Recheck TSH.  Also check thyroid peroxidase antibody Referral to endocrinology per patient request.  I am having Julie Bowman maintain her levothyroxine and ALPRAZolam.  No orders of the defined types were placed in this encounter.

## 2024-01-19 NOTE — Patient Instructions (Signed)
 Please go downstairs for labs before you leave.  I will be in touch with your results and with recommendations.  I referred you to an endocrinologist and you should receive a call in the next few days.    Let me know if it has been more than 2 weeks and you have not heard from anyone.

## 2024-01-20 LAB — THYROID PEROXIDASE ANTIBODY: Thyroperoxidase Ab SerPl-aCnc: 407 [IU]/mL — ABNORMAL HIGH (ref <9)

## 2024-01-21 ENCOUNTER — Encounter: Payer: Self-pay | Admitting: Family Medicine

## 2024-02-08 ENCOUNTER — Encounter: Payer: Self-pay | Admitting: Radiology

## 2024-02-08 ENCOUNTER — Other Ambulatory Visit (HOSPITAL_COMMUNITY)
Admission: RE | Admit: 2024-02-08 | Discharge: 2024-02-08 | Disposition: A | Discharge status: Home / Self Care | Source: Ambulatory Visit | Attending: Radiology | Admitting: Radiology

## 2024-02-08 ENCOUNTER — Ambulatory Visit (INDEPENDENT_AMBULATORY_CARE_PROVIDER_SITE_OTHER): Payer: Self-pay | Admitting: Radiology

## 2024-02-08 VITALS — BP 112/72 | HR 87 | Ht 61.0 in | Wt 117.8 lb

## 2024-02-08 DIAGNOSIS — Z01419 Encounter for gynecological examination (general) (routine) without abnormal findings: Secondary | ICD-10-CM

## 2024-02-08 DIAGNOSIS — N926 Irregular menstruation, unspecified: Secondary | ICD-10-CM

## 2024-02-08 DIAGNOSIS — Z113 Encounter for screening for infections with a predominantly sexual mode of transmission: Secondary | ICD-10-CM | POA: Diagnosis present

## 2024-02-08 DIAGNOSIS — Z1331 Encounter for screening for depression: Secondary | ICD-10-CM | POA: Diagnosis not present

## 2024-02-08 NOTE — Progress Notes (Signed)
 Julie Bowman 06/28/1996 [id number]   History:  28 y.o. G2P0 presents for annual exam. C/o painful, heavy, irregular cycles every 28-25 days in length. Concerned about PCOS and future fertility. Would like labs. Recently diagnosed with hypothyroidism. Has tried OCPs in the past which caused depressive symptoms, does not remember which pill it was.   Gynecologic History Patient's last menstrual period was 02/03/2024 (exact date). Period Cycle (Days): 28 Period Duration (Days): 5 Period Pattern: Regular Menstrual Flow: Heavy Menstrual Control: Tampon Dysmenorrhea: (!) Severe (severe every other month, unable to work) Dysmenorrhea Symptoms: Cramping Contraception/Family planning: rhythm method Sexually active: yes Last Pap: 2022  Obstetric History OB History  Gravida Para Term Preterm AB Living  2 0   1 0  SAB IAB Ectopic Multiple Live Births          # Outcome Date GA Lbr Len/2nd Weight Sex Type Anes PTL Lv  2 AB           1 Gravida                02/08/2024    2:18 PM 12/17/2023    1:49 PM 09/23/2020   11:20 AM  Depression screen PHQ 2/9  Decreased Interest 0 0 0  Down, Depressed, Hopeless 0 1 0  PHQ - 2 Score 0 1 0  Altered sleeping   1  Tired, decreased energy   1  Change in appetite   0  Feeling bad or failure about yourself    0  Trouble concentrating   0  Moving slowly or fidgety/restless   0  Suicidal thoughts   0  PHQ-9 Score   2     The following portions of the patient's history were reviewed and updated as appropriate: allergies, current medications, past family history, past medical history, past social history, past surgical history, and problem list.  Review of Systems  All other systems reviewed and are negative.   Past medical history, past surgical history, family history and social history were all reviewed and documented in the EPIC chart.  Exam:  Vitals:   02/08/24 1416  BP: 112/72  Pulse: 87  SpO2: 98%  Weight: 117 lb 12.8 oz (53.4 kg)   Height: 5\' 1"  (1.549 m)   Body mass index is 22.26 kg/m.  Physical Exam Vitals and nursing note reviewed. Exam conducted with a chaperone present.  Constitutional:      Appearance: Normal appearance. She is normal weight.  HENT:     Head: Normocephalic and atraumatic.  Neck:     Thyroid: No thyroid mass, thyromegaly or thyroid tenderness.  Cardiovascular:     Rate and Rhythm: Regular rhythm.     Heart sounds: Normal heart sounds.  Pulmonary:     Effort: Pulmonary effort is normal.     Breath sounds: Normal breath sounds.  Chest:  Breasts:    Breasts are symmetrical.     Right: Normal. No inverted nipple, mass, nipple discharge, skin change or tenderness.     Left: Normal. No inverted nipple, mass, nipple discharge, skin change or tenderness.  Abdominal:     General: Abdomen is flat. Bowel sounds are normal.     Palpations: Abdomen is soft.  Genitourinary:    General: Normal vulva.     Vagina: Normal. No vaginal discharge, bleeding or lesions.     Cervix: Normal. No discharge or lesion.     Uterus: Normal. Not enlarged and not tender.      Adnexa: Right adnexa normal  and left adnexa normal.       Right: No mass, tenderness or fullness.         Left: No mass, tenderness or fullness.    Lymphadenopathy:     Upper Body:     Right upper body: No axillary adenopathy.     Left upper body: No axillary adenopathy.  Skin:    General: Skin is warm and dry.  Neurological:     Mental Status: She is alert and oriented to person, place, and time.  Psychiatric:        Mood and Affect: Mood normal.        Thought Content: Thought content normal.        Judgment: Judgment normal.      Raynelle Fanning, CMA present for exam  Assessment/Plan:   1. Well woman exam with routine gynecological exam (Primary) - Cytology - PAP( [place])  2. Screening for STDs (sexually transmitted diseases) - Cytology - PAP( [place])  3. Menstrual periods irregular Will return next cycle day 3-5  for labs - 17-Hydroxyprogesterone; Future - Follicle stimulating hormone; Future - Luteinizing hormone; Future - Prolactin; Future - Hemoglobin A1c; Future - Testos,Total,Free and SHBG (Female); Future     Arlie Solomons B WHNP-BC 2:39 PM 02/08/2024

## 2024-02-08 NOTE — Patient Instructions (Signed)

## 2024-02-11 LAB — CYTOLOGY - PAP
Chlamydia: NEGATIVE
Comment: NEGATIVE
Comment: NEGATIVE
Comment: NEGATIVE
Comment: NORMAL
High risk HPV: NEGATIVE
Neisseria Gonorrhea: NEGATIVE
Trichomonas: NEGATIVE

## 2024-02-14 ENCOUNTER — Other Ambulatory Visit: Payer: Self-pay | Admitting: Family Medicine

## 2024-02-15 ENCOUNTER — Other Ambulatory Visit: Payer: Self-pay | Admitting: *Deleted

## 2024-02-15 DIAGNOSIS — R87619 Unspecified abnormal cytological findings in specimens from cervix uteri: Secondary | ICD-10-CM

## 2024-02-23 ENCOUNTER — Other Ambulatory Visit (HOSPITAL_COMMUNITY)
Admission: RE | Admit: 2024-02-23 | Discharge: 2024-02-23 | Disposition: A | Discharge status: Home / Self Care | Source: Ambulatory Visit | Attending: Radiology | Admitting: Radiology

## 2024-02-23 ENCOUNTER — Ambulatory Visit: Admitting: Radiology

## 2024-02-23 ENCOUNTER — Encounter: Payer: Self-pay | Admitting: Radiology

## 2024-02-23 VITALS — BP 116/78 | Wt 118.6 lb

## 2024-02-23 DIAGNOSIS — Z01812 Encounter for preprocedural laboratory examination: Secondary | ICD-10-CM | POA: Diagnosis not present

## 2024-02-23 DIAGNOSIS — N921 Excessive and frequent menstruation with irregular cycle: Secondary | ICD-10-CM | POA: Diagnosis not present

## 2024-02-23 DIAGNOSIS — R87619 Unspecified abnormal cytological findings in specimens from cervix uteri: Secondary | ICD-10-CM

## 2024-02-23 LAB — PREGNANCY, URINE: Preg Test, Ur: NEGATIVE

## 2024-02-23 NOTE — Progress Notes (Signed)
 28 y.o. G21P0010 female here for colposcopy. Atypical glandular cells on pap. Heavy menses.  Patient's last menstrual period was 02/03/2024 (exact date).   Birth control: rhythm method  Last PAP:    Component Value Date/Time   DIAGPAP - Atypical glandular cells, NOS (A) 02/08/2024 1437   HPVHIGH Negative 02/08/2024 1437   ADEQPAP  02/08/2024 1437    Satisfactory for evaluation; transformation zone component PRESENT.     OB History  Gravida Para Term Preterm AB Living  2 0   1 0  SAB IAB Ectopic Multiple Live Births          # Outcome Date GA Lbr Len/2nd Weight Sex Type Anes PTL Lv  2 AB           1 Gravida             Past Medical History:  Diagnosis Date   Anxiety    Hashimoto's disease    Hypothyroidism    Medical history non-contributory     Past Surgical History:  Procedure Laterality Date   TONSILLECTOMY      Current Outpatient Medications on File Prior to Visit  Medication Sig Dispense Refill   ALPRAZolam (XANAX) 0.5 MG tablet Take 1 tablet 30-45 minutes before MRI. (Patient taking differently: Take 1 tablet 30-45 minutes before MRI or before seeing doctor) 2 tablet 0   ibuprofen (ADVIL) 200 MG tablet Take 200 mg by mouth every 6 (six) hours as needed.     levothyroxine (SYNTHROID) 75 MCG tablet TAKE 1 TABLET(75 MCG) BY MOUTH DAILY BEFORE BREAKFAST 30 tablet 1   No current facility-administered medications on file prior to visit.    Allergies  Allergen Reactions   Sulfa Antibiotics Other (See Comments)    Unsure of reaction mother told her she was alergic      PE Today's Vitals   02/23/24 1353  BP: 116/78  Weight: 118 lb 9.6 oz (53.8 kg)   Body mass index is 22.41 kg/m.  Physical Exam Vitals and nursing note reviewed.  Constitutional:      Appearance: Normal appearance. She is normal weight.  Neurological:     Mental Status: She is alert.  Psychiatric:        Mood and Affect: Mood normal.        Thought Content: Thought content  normal.        Judgment: Judgment normal.      Colposcopy/EMB Procedure Consented for procedure.  Time out performed. Speculum placed in vagina.  A single-toothed tenaculum was placed on the anterior lip of the cervix to stabilize it.  The 3 mm pipelle was introduced into the endometrial cavity without difficulty to a depth of 7cm, suction initiated and a moderate amount of tissue was obtained and sent to pathology Acetic acid 3% was then applied to cervix.  Satisfactory colposcopy. TZ was completely seen. Biopsies taken Yes.   Location(s) - 12 oclock  ECC was performed. yes Specimens to pathology separately.  Monsel's applied to biopsy areas.   Good hemostasis.  Minimal EBL. No complications.  Tolerated well.     Raynelle Fanning, CMA and Dr Edward Jolly present for procedure   Assessment and Plan:       1. Atypical glandular cells of undetermined significance (AGUS) on cervical Pap smear (Primary) - Surgical pathology( [place]/ [facility])   2. Menometrorrhagia - Endometrial biopsy - Surgical pathology( [place]/ [facility])  3. Encounter for preprocedural laboratory examination - Pregnancy, urine; negative    Abnormal  PAP results reviewed. ASCCP guidelines reviewed. Consents signed. Satisfatory colposcopy, ECC and biopsy performed. Aftercare instructions provided.  Will call with results. I also recommend completion of Gardasil vaccine up to age 45yo, avoidance of smoking, and use of condoms with new sexual partners to prevent progression of disease.   [place] [facility], NP

## 2024-02-25 LAB — SURGICAL PATHOLOGY

## 2024-03-03 ENCOUNTER — Ambulatory Visit

## 2024-03-03 ENCOUNTER — Other Ambulatory Visit: Payer: Self-pay | Admitting: Family Medicine

## 2024-03-03 ENCOUNTER — Telehealth: Payer: Self-pay | Admitting: Family Medicine

## 2024-03-03 DIAGNOSIS — E039 Hypothyroidism, unspecified: Secondary | ICD-10-CM

## 2024-03-03 NOTE — Telephone Encounter (Signed)
 Spoke w/ the pt, she voiced understanding/appreciation for the cb.

## 2024-03-03 NOTE — Telephone Encounter (Signed)
 TSH just checked 01/19/24, pt wants lab order to have it rechecked

## 2024-03-03 NOTE — Telephone Encounter (Signed)
 Copied from CRM [id number]. Topic: Clinical - Request for Lab/Test Order >> Mar 03, 2024 12:48 PM [place] M wrote: Reason for CRM: patient wanting to get a lab order to get TSH checked, the medication levothyroxine  is giving her diarrhea and she has warm skin, weight loss in the last 3 months

## 2024-03-03 NOTE — Telephone Encounter (Signed)
 Pt LVM in triage line stating that she is calling about her results. "Tries not to use mychart due to giving anxiety."  First attempt: no answer and unable to leave VM due to no VM set up.  Second attempt: no answer and unable to leave VM due to no VM setup.

## 2024-03-06 ENCOUNTER — Other Ambulatory Visit (INDEPENDENT_AMBULATORY_CARE_PROVIDER_SITE_OTHER)

## 2024-03-06 DIAGNOSIS — E039 Hypothyroidism, unspecified: Secondary | ICD-10-CM

## 2024-03-06 NOTE — Telephone Encounter (Signed)
 Replied to pt via my chart.

## 2024-03-07 ENCOUNTER — Other Ambulatory Visit: Payer: Self-pay | Admitting: Family Medicine

## 2024-03-07 ENCOUNTER — Encounter: Payer: Self-pay | Admitting: Family Medicine

## 2024-03-07 LAB — TSH: TSH: 0.97 u[IU]/mL (ref 0.35–5.50)

## 2024-03-07 MED ORDER — LEVOTHYROXINE SODIUM 50 MCG PO TABS: 50.0000 ug | ORAL_TABLET | Freq: Every day | ORAL | 1 refills | Status: DC

## 2024-03-07 NOTE — Progress Notes (Signed)
 Her TSH is now low normal.  I recommend that we reduce her dose of levothyroxine .  She okay with this?  I do see that she has an appointment with endocrinology next month.  She can wait and see them for the dose reduction or if we go ahead and reduce the dose then she we will see them and they will check her TSH at that time.

## 2024-03-23 ENCOUNTER — Other Ambulatory Visit: Payer: Self-pay

## 2024-03-23 DIAGNOSIS — E039 Hypothyroidism, unspecified: Secondary | ICD-10-CM

## 2024-03-24 ENCOUNTER — Other Ambulatory Visit (INDEPENDENT_AMBULATORY_CARE_PROVIDER_SITE_OTHER)

## 2024-03-24 DIAGNOSIS — E039 Hypothyroidism, unspecified: Secondary | ICD-10-CM

## 2024-03-28 ENCOUNTER — Ambulatory Visit: Payer: Self-pay | Admitting: Family Medicine

## 2024-03-28 LAB — TSH: TSH: 3.3 u[IU]/mL (ref 0.35–5.50)

## 2024-04-05 ENCOUNTER — Ambulatory Visit (INDEPENDENT_AMBULATORY_CARE_PROVIDER_SITE_OTHER): Admitting: "Endocrinology

## 2024-04-05 ENCOUNTER — Encounter: Payer: Self-pay | Admitting: "Endocrinology

## 2024-04-05 VITALS — BP 100/80 | HR 84 | Ht 61.0 in | Wt 115.0 lb

## 2024-04-05 DIAGNOSIS — E039 Hypothyroidism, unspecified: Secondary | ICD-10-CM | POA: Diagnosis not present

## 2024-04-05 MED ORDER — LEVOTHYROXINE SODIUM 75 MCG PO TABS: 75.0000 ug | ORAL_TABLET | Freq: Every day | ORAL | 1 refills | Status: DC

## 2024-04-05 NOTE — Patient Instructions (Signed)
  Recommend to take levothyroxine first thing in the morning on empty stomach and wait at least 30 minutes to 1 hour before eating or drinking anything or taking any other medications. Space out levothyroxine by 4 hours from any acid reflux medication, fibrate, iron, calcium, multivitamin, birth control pills and nutritional supplements.

## 2024-04-05 NOTE — Progress Notes (Signed)
 Outpatient Endocrinology Note Jorge Newcomer, MD  04/05/24   Julie Bowman 1996/11/08 [id number]  Referring Provider: Abram Abraham, NP-C Primary Care Provider: Abram Abraham, NP-C Subjective  No chief complaint on file.   Assessment & Plan  Diagnoses and all orders for this visit:  Acquired hypothyroidism -     TSH + free T4  Other orders -     levothyroxine  (SYNTHROID ) 75 MCG tablet; Take 1 tablet (75 mcg total) by mouth daily.   Maryhelen Lindler is currently taking levothyroxine  50 mcg po qd. Patient is currently biochemically euthyroid.  Educated on thyroid  axis.  Recommend the following: Take levothyroxine  75 mcg every morning (can take 6 or 6.5 pills a week if experiences diarrhea/worsening anxiety) .  Advised to take levothyroxine  first thing in the morning on empty stomach and wait at least 30 minutes to 1 hour before eating or drinking anything or taking any other medications. Space out levothyroxine  by 4 hours from any acid reflux medication/fibrate/iron/calcium/multivitamin. Advised to take birth control pills and nutritional supplements in the evening. Repeat lab before next visit or sooner if symptoms of hyperthyroidism or hypothyroidism develop.  Notify us  immediately in case of pregnancy/breastfeeding or significant weight gain or loss. Counseled on compliance and follow up needs.  I have reviewed current medications, nurse's notes, allergies, vital signs, past medical and surgical history, family medical history, and social history for this encounter. Counseled patient on symptoms, examination findings, lab findings, imaging results, treatment decisions and monitoring and prognosis. The patient understood the recommendations and agrees with the treatment plan. All questions regarding treatment plan were fully answered.   Return in about 3 months (around 07/06/2024) for visit + labs before next visit.   Jorge Newcomer, MD  04/05/24   I have reviewed  current medications, nurse's notes, allergies, vital signs, past medical and surgical history, family medical history, and social history for this encounter. Counseled patient on symptoms, examination findings, lab findings, imaging results, treatment decisions and monitoring and prognosis. The patient understood the recommendations and agrees with the treatment plan. All questions regarding treatment plan were fully answered.   History of Present Illness Julie Bowman is a 28 y.o. year old female who presents to our clinic with hypothyroidism diagnosed in 12/2023 with TSH of 202, FT4 of 0.16 and TPO +.    Symptoms suggestive of HYPOTHYROIDISM:  fatigue Yes weight gain Yes cold intolerance  Yes constipation  Yes  Symptoms suggestive of HYPERTHYROIDISM:  weight loss  No heat intolerance No hyperdefecation  No palpitations  Yes  Compressive symptoms:  dysphagia  No dysphonia  No positional dyspnea (especially with simultaneous arms elevation)  No  Smokes  No On biotin  No Personal history of head/neck surgery/irradiation  Yes s/p B/L tonsillectomy   Physical Exam  BP 100/80   Pulse 84   Ht 5\' 1"  (1.549 m)   Wt 115 lb (52.2 kg)   SpO2 98%   BMI 21.73 kg/m  Constitutional: well developed, well nourished Head: normocephalic, atraumatic, no exophthalmos Eyes: sclera anicteric, no redness Neck: no thyromegaly, no thyroid  tenderness; no nodules palpated Lungs: normal respiratory effort Neurology: alert and oriented, no fine hand tremor Skin: dry, no appreciable rashes Musculoskeletal: no appreciable defects Psychiatric: normal mood and affect  Allergies Allergies  Allergen Reactions   Sulfa Antibiotics Other (See Comments)    Unsure of reaction mother told her she was alergic    Current Medications Patient's Medications  New Prescriptions   LEVOTHYROXINE  (SYNTHROID )  75 MCG TABLET    Take 1 tablet (75 mcg total) by mouth daily.  Previous Medications   ALPRAZOLAM   (XANAX ) 0.5 MG TABLET    Take 1 tablet 30-45 minutes before MRI.   IBUPROFEN  (ADVIL ) 200 MG TABLET    Take 200 mg by mouth every 6 (six) hours as needed.  Modified Medications   No medications on file  Discontinued Medications   LEVOTHYROXINE  (SYNTHROID ) 50 MCG TABLET    Take 1 tablet (50 mcg total) by mouth daily.    Past Medical History Past Medical History:  Diagnosis Date   Anxiety    Hashimoto's disease    Hypothyroidism    Medical history non-contributory     Past Surgical History Past Surgical History:  Procedure Laterality Date   TONSILLECTOMY      Family History family history includes Cancer in her mother; Hypothyroidism in her maternal aunt and maternal grandmother; Pancreatic cancer in her father.  Social History Social History   Socioeconomic History   Marital status: Single    Spouse name: Not on file   Number of children: Not on file   Years of education: Not on file   Highest education level: Not on file  Occupational History   Not on file  Tobacco Use   Smoking status: Every Day    Types: E-cigarettes    Passive exposure: Past   Smokeless tobacco: Never   Tobacco comments:    VAPES  Vaping Use   Vaping status: Every Day  Substance and Sexual Activity   Alcohol use: Yes    Comment: occ   Drug use: Not Currently   Sexual activity: Yes    Partners: Male    Birth control/protection: None    Comment: menarche 28yo, sexual debut 28yo  Other Topics Concern   Not on file  Social History Narrative   Not on file   Social Drivers of Health   Financial Resource Strain: Not on file  Food Insecurity: No Food Insecurity (09/23/2020)   Hunger Vital Sign    Worried About Running Out of Food in the Last Year: Never true    Ran Out of Food in the Last Year: Never true  Transportation Needs: No Transportation Needs (09/23/2020)   PRAPARE - Administrator, Civil Service (Medical): No    Lack of Transportation (Non-Medical): No  Physical  Activity: Not on file  Stress: Not on file  Social Connections: Not on file  Intimate Partner Violence: Not on file    Laboratory Investigations Lab Results  Component Value Date   TSH 3.30 03/24/2024   TSH 0.97 03/06/2024   TSH 3.11 01/19/2024   FREET4 0.16 (L) 12/17/2023     No results found for: "TSI"   No components found for: "TRAB"   Lab Results  Component Value Date   CHOL 151 09/22/2014   Lab Results  Component Value Date   HDL 50 09/22/2014   Lab Results  Component Value Date   LDLCALC 90 09/22/2014   Lab Results  Component Value Date   TRIG 56 09/22/2014   Lab Results  Component Value Date   CHOLHDL 3.0 09/22/2014   Lab Results  Component Value Date   CREATININE 1.03 12/17/2023   Lab Results  Component Value Date   GFR 74.54 12/17/2023      Component Value Date/Time   NA 141 12/17/2023 1452   K 4.2 12/17/2023 1452   CL 104 12/17/2023 1452   CO2 26 12/17/2023 1452  GLUCOSE 78 12/17/2023 1452   BUN 15 12/17/2023 1452   CREATININE 1.03 12/17/2023 1452   CALCIUM 9.8 12/17/2023 1452   PROT 7.8 12/17/2023 1452   ALBUMIN 5.1 12/17/2023 1452   AST 20 12/17/2023 1452   ALT 14 12/17/2023 1452   ALKPHOS 43 12/17/2023 1452   BILITOT 0.7 12/17/2023 1452   GFRNONAA >60 09/20/2020 1418   GFRAA >60 11/25/2018 1737      Latest Ref Rng & Units 12/17/2023    2:52 PM 09/20/2020    2:18 PM 11/25/2018    5:37 PM  BMP  Glucose 70 - 99 mg/dL 78  161  90   BUN 6 - 23 mg/dL 15  11  13    Creatinine 0.40 - 1.20 mg/dL 0.96  0.45  4.09   Sodium 135 - 145 mEq/L 141  136  135   Potassium 3.5 - 5.1 mEq/L 4.2  3.7  3.7   Chloride 96 - 112 mEq/L 104  102  106   CO2 19 - 32 mEq/L 26  23  22    Calcium 8.4 - 10.5 mg/dL 9.8  9.6  8.3        Component Value Date/Time   WBC 6.9 12/17/2023 1452   RBC 4.37 12/17/2023 1452   HGB 14.0 12/17/2023 1452   HCT 41.8 12/17/2023 1452   PLT 258.0 12/17/2023 1452   MCV 95.6 12/17/2023 1452   MCV 95.7 04/24/2013 2059   MCH  31.5 09/20/2020 1418   MCHC 33.6 12/17/2023 1452   RDW 13.3 12/17/2023 1452   LYMPHSABS 1.9 12/17/2023 1452   MONOABS 0.5 12/17/2023 1452   EOSABS 0.3 12/17/2023 1452   BASOSABS 0.1 12/17/2023 1452      Parts of this note may have been dictated using voice recognition software. There may be variances in spelling and vocabulary which are unintentional. Not all errors are proofread. Please notify the [facility] if any discrepancies are noted or if the meaning of any statement is not clear.

## 2024-05-17 ENCOUNTER — Encounter: Payer: Self-pay | Admitting: "Endocrinology

## 2024-05-18 ENCOUNTER — Other Ambulatory Visit: Payer: Self-pay | Admitting: "Endocrinology

## 2024-05-18 DIAGNOSIS — E039 Hypothyroidism, unspecified: Secondary | ICD-10-CM

## 2024-05-18 NOTE — Telephone Encounter (Signed)
 Lvm for pt to call back regarding symptoms and medication.

## 2024-05-19 ENCOUNTER — Other Ambulatory Visit

## 2024-05-19 LAB — TSH+FREE T4: TSH W/REFLEX TO FT4: 2.67 m[IU]/L

## 2024-05-23 ENCOUNTER — Ambulatory Visit: Admitting: "Endocrinology

## 2024-05-23 ENCOUNTER — Encounter: Payer: Self-pay | Admitting: "Endocrinology

## 2024-05-23 VITALS — BP 100/70 | HR 85 | Ht 61.0 in | Wt 109.0 lb

## 2024-05-23 DIAGNOSIS — E039 Hypothyroidism, unspecified: Secondary | ICD-10-CM | POA: Diagnosis not present

## 2024-05-23 NOTE — Progress Notes (Signed)
 Outpatient Endocrinology Note Julie Birmingham, MD  05/23/24   Julie Bowman 1996/04/18 [id number]  Referring Provider: Lendia Boby CROME, NP-C Primary Care Provider: Lendia Boby CROME, NP-C Subjective  No chief complaint on file.  Assessment & Plan  Diagnoses and all orders for this visit:  Acquired hypothyroidism -     TSH + free T4    Rayni Nemitz is currently taking levothyroxine  75 mcg po qd. Patient is currently biochemically euthyroid.  Educated on thyroid  axis.  Recommend the following: Take levothyroxine  75 mcg every morning.  Advised to take levothyroxine  first thing in the morning on empty stomach and wait at least 30 minutes to 1 hour before eating or drinking anything or taking any other medications. Space out levothyroxine  by 4 hours from any acid reflux medication/fibrate/iron/calcium/multivitamin. Advised to take birth control pills and nutritional supplements in the evening. Repeat lab before next visit or sooner if symptoms of hyperthyroidism or hypothyroidism develop.  Notify us  immediately in case of pregnancy/breastfeeding or significant weight gain or loss. Counseled on compliance and follow up needs.  I have reviewed current medications, nurse's notes, allergies, vital signs, past medical and surgical history, family medical history, and social history for this encounter. Counseled patient on symptoms, examination findings, lab findings, imaging results, treatment decisions and monitoring and prognosis. The patient understood the recommendations and agrees with the treatment plan. All questions regarding treatment plan were fully answered.   Return in about 5 months (around 10/23/2024) for visit + labs before next visit.   Julie Birmingham, MD  05/23/24   I have reviewed current medications, nurse's notes, allergies, vital signs, past medical and surgical history, family medical history, and social history for this encounter. Counseled patient on  symptoms, examination findings, lab findings, imaging results, treatment decisions and monitoring and prognosis. The patient understood the recommendations and agrees with the treatment plan. All questions regarding treatment plan were fully answered.   History of Present Illness Julie Bowman is a 28 y.o. year old female who presents to our clinic with hypothyroidism diagnosed in 12/2023 with TSH of 202, FT4 of 0.16 and TPO +.    Patient was concerned because her heart rate was up to 70s, which is higher than her baseline, at the time when she had diarrhea.  Reports that her heart rate has improved but she still had 1 episode of diarrhea today.  She cut down her dose a couple of days and then resumed her regular dose.  Symptoms suggestive of HYPOTHYROIDISM:  fatigue No weight gain No cold intolerance  Yes, baseline  constipation  No  Symptoms suggestive of HYPERTHYROIDISM:  weight loss  No heat intolerance No hyperdefecation  No palpitations  Yes  Compressive symptoms:  dysphagia  No dysphonia  No positional dyspnea (especially with simultaneous arms elevation)  No  Smokes  No On biotin  No Personal history of head/neck surgery/irradiation  Yes s/p B/L tonsillectomy   Physical Exam  BP 100/70   Pulse 85   Ht 5' 1 (1.549 m)   Wt 109 lb (49.4 kg)   SpO2 98%   BMI 20.60 kg/m  Constitutional: well developed, well nourished Head: normocephalic, atraumatic, no exophthalmos Eyes: sclera anicteric, no redness Neck: no thyromegaly, no thyroid  tenderness; no nodules palpated Lungs: normal respiratory effort Neurology: alert and oriented, no fine hand tremor Skin: dry, no appreciable rashes Musculoskeletal: no appreciable defects Psychiatric: normal mood and affect  Allergies Allergies  Allergen Reactions   Sulfa Antibiotics Other (See Comments)  Unsure of reaction mother told her she was alergic    Current Medications Patient's Medications  New Prescriptions   No  medications on file  Previous Medications   ALPRAZOLAM  (XANAX ) 0.5 MG TABLET    Take 1 tablet 30-45 minutes before MRI.   IBUPROFEN  (ADVIL ) 200 MG TABLET    Take 200 mg by mouth every 6 (six) hours as needed.   LEVOTHYROXINE  (SYNTHROID ) 75 MCG TABLET    Take 1 tablet (75 mcg total) by mouth daily.  Modified Medications   No medications on file  Discontinued Medications   No medications on file    Past Medical History Past Medical History:  Diagnosis Date   Anxiety    Hashimoto's disease    Hypothyroidism    Medical history non-contributory     Past Surgical History Past Surgical History:  Procedure Laterality Date   TONSILLECTOMY      Family History family history includes Cancer in her mother; Hypothyroidism in her maternal aunt and maternal grandmother; Pancreatic cancer in her father.  Social History Social History   Socioeconomic History   Marital status: Single    Spouse name: Not on file   Number of children: Not on file   Years of education: Not on file   Highest education level: Not on file  Occupational History   Not on file  Tobacco Use   Smoking status: Every Day    Types: E-cigarettes    Passive exposure: Past   Smokeless tobacco: Never   Tobacco comments:    VAPES  Vaping Use   Vaping status: Every Day  Substance and Sexual Activity   Alcohol use: Yes    Comment: occ   Drug use: Not Currently   Sexual activity: Yes    Partners: Male    Birth control/protection: None    Comment: menarche 28yo, sexual debut 28yo  Other Topics Concern   Not on file  Social History Narrative   Not on file   Social Drivers of Health   Financial Resource Strain: Not on file  Food Insecurity: No Food Insecurity (09/23/2020)   Hunger Vital Sign    Worried About Running Out of Food in the Last Year: Never true    Ran Out of Food in the Last Year: Never true  Transportation Needs: No Transportation Needs (09/23/2020)   PRAPARE - Scientist, research (physical sciences) (Medical): No    Lack of Transportation (Non-Medical): No  Physical Activity: Not on file  Stress: Not on file  Social Connections: Not on file  Intimate Partner Violence: Not on file    Laboratory Investigations Lab Results  Component Value Date   TSH 3.30 03/24/2024   TSH 0.97 03/06/2024   TSH 3.11 01/19/2024   FREET4 0.16 (L) 12/17/2023     No results found for: TSI   No components found for: TRAB   Lab Results  Component Value Date   CHOL 151 09/22/2014   Lab Results  Component Value Date   HDL 50 09/22/2014   Lab Results  Component Value Date   LDLCALC 90 09/22/2014   Lab Results  Component Value Date   TRIG 56 09/22/2014   Lab Results  Component Value Date   CHOLHDL 3.0 09/22/2014   Lab Results  Component Value Date   CREATININE 1.03 12/17/2023   Lab Results  Component Value Date   GFR 74.54 12/17/2023      Component Value Date/Time   NA 141 12/17/2023 1452  K 4.2 12/17/2023 1452   CL 104 12/17/2023 1452   CO2 26 12/17/2023 1452   GLUCOSE 78 12/17/2023 1452   BUN 15 12/17/2023 1452   CREATININE 1.03 12/17/2023 1452   CALCIUM 9.8 12/17/2023 1452   PROT 7.8 12/17/2023 1452   ALBUMIN 5.1 12/17/2023 1452   AST 20 12/17/2023 1452   ALT 14 12/17/2023 1452   ALKPHOS 43 12/17/2023 1452   BILITOT 0.7 12/17/2023 1452   GFRNONAA >60 09/20/2020 1418   GFRAA >60 11/25/2018 1737      Latest Ref Rng & Units 12/17/2023    2:52 PM 09/20/2020    2:18 PM 11/25/2018    5:37 PM  BMP  Glucose 70 - 99 mg/dL 78  890  90   BUN 6 - 23 mg/dL 15  11  13    Creatinine 0.40 - 1.20 mg/dL 8.96  9.08  9.25   Sodium 135 - 145 mEq/L 141  136  135   Potassium 3.5 - 5.1 mEq/L 4.2  3.7  3.7   Chloride 96 - 112 mEq/L 104  102  106   CO2 19 - 32 mEq/L 26  23  22    Calcium 8.4 - 10.5 mg/dL 9.8  9.6  8.3        Component Value Date/Time   WBC 6.9 12/17/2023 1452   RBC 4.37 12/17/2023 1452   HGB 14.0 12/17/2023 1452   HCT 41.8 12/17/2023 1452   PLT  258.0 12/17/2023 1452   MCV 95.6 12/17/2023 1452   MCV 95.7 04/24/2013 2059   MCH 31.5 09/20/2020 1418   MCHC 33.6 12/17/2023 1452   RDW 13.3 12/17/2023 1452   LYMPHSABS 1.9 12/17/2023 1452   MONOABS 0.5 12/17/2023 1452   EOSABS 0.3 12/17/2023 1452   BASOSABS 0.1 12/17/2023 1452      Parts of this note may have been dictated using voice recognition software. There may be variances in spelling and vocabulary which are unintentional. Not all errors are proofread. Please notify the [facility] if any discrepancies are noted or if the meaning of any statement is not clear.

## 2024-05-25 ENCOUNTER — Encounter: Payer: Self-pay | Admitting: Family Medicine

## 2024-06-19 ENCOUNTER — Telehealth: Payer: Self-pay

## 2024-06-19 ENCOUNTER — Ambulatory Visit: Payer: Self-pay

## 2024-06-19 NOTE — Telephone Encounter (Signed)
 FYI Only or Action Required?: FYI only for provider.  Patient was last seen in primary care on 01/19/2024 by Julie Boby CROME, NP-C.  Called Nurse Triage reporting Anxiety.  Symptoms began several days ago.  Interventions attempted: Nothing.  Symptoms are: unchanged. Anxiety indigestion, some chest tightness - fatigue. New diet.  Triage Disposition: See PCP When Office is Open (Within 3 Days)  Patient/caregiver understands and will follow disposition?: Yes                   Reason for Disposition  MODERATE anxiety (e.g., persistent or frequent anxiety symptoms; interferes with sleep, school, or work)  Answer Assessment - Initial Assessment Questions 1. CONCERN: Did anything happen that prompted you to call today?      No - fear 2. ANXIETY SYMPTOMS: Can you describe how you (your loved one; patient) have been feeling? (e.g., tense, restless, panicky, anxious, keyed up, overwhelmed, sense of impending doom).      tense 3. ONSET: How long have you been feeling this way? (e.g., hours, days, weeks)     Ongoing - pt is having some burping/ indigestion 4. SEVERITY: How would you rate the level of anxiety? (e.g., 0 - 10; or mild, moderate, severe).     mild 5. FUNCTIONAL IMPAIRMENT: How have these feelings affected your ability to do daily activities? Have you had more difficulty than usual doing your normal daily activities? (e.g., getting better, same, worse; self-care, school, work, interactions)     no 6. HISTORY: Have you felt this way before? Have you ever been diagnosed with an anxiety problem in the past? (e.g., generalized anxiety disorder, panic attacks, PTSD). If Yes, ask: How was this problem treated? (e.g., medicines, counseling, etc.)     yes 12. OTHER SYMPTOMS: Do you have any other symptoms? (e.g., feeling depressed, trouble concentrating, trouble sleeping, trouble breathing, palpitations or fast heartbeat, chest pain, sweating, nausea, or  diarrhea)       Pt is having indigestion, and has some chest discomfort, burping - possibly from dietary changes  Protocols used: Anxiety and Panic Attack-A-AH

## 2024-06-19 NOTE — Telephone Encounter (Signed)
    Patient hung up prior to being transferred to this RN This RN called patient back for triage--No answer--Voicemail box not set up                     Copied from CRM #[id number]. Topic: Clinical - Red Word Triage >> Jun 19, 2024 11:08 AM Lavanda D wrote: Red Word that prompted transfer to Nurse Triage: Anxiety: Patient experiencing anxiety along with some body aches/indigestion. She says this started last week Monday.   ----------------------------------------------------------------------- From previous Reason for Contact - Scheduling: Patient/patient representative is calling to schedule an appointment. Refer to attachments for appointment information.

## 2024-06-19 NOTE — Telephone Encounter (Signed)
 Pt lvm regarding some symptom she has been having. Called pt back was unable to speak to pt lvm.

## 2024-06-21 ENCOUNTER — Encounter: Payer: Self-pay | Admitting: Internal Medicine

## 2024-06-21 ENCOUNTER — Ambulatory Visit (INDEPENDENT_AMBULATORY_CARE_PROVIDER_SITE_OTHER): Admitting: Internal Medicine

## 2024-06-21 VITALS — BP 114/78 | HR 84 | Temp 98.4°F | Ht 61.0 in | Wt 108.2 lb

## 2024-06-21 DIAGNOSIS — K30 Functional dyspepsia: Secondary | ICD-10-CM | POA: Insufficient documentation

## 2024-06-21 DIAGNOSIS — U071 COVID-19: Secondary | ICD-10-CM | POA: Insufficient documentation

## 2024-06-21 DIAGNOSIS — F418 Other specified anxiety disorders: Secondary | ICD-10-CM

## 2024-06-21 DIAGNOSIS — M546 Pain in thoracic spine: Secondary | ICD-10-CM

## 2024-06-21 DIAGNOSIS — F419 Anxiety disorder, unspecified: Secondary | ICD-10-CM | POA: Insufficient documentation

## 2024-06-21 DIAGNOSIS — E039 Hypothyroidism, unspecified: Secondary | ICD-10-CM | POA: Insufficient documentation

## 2024-06-21 DIAGNOSIS — E063 Autoimmune thyroiditis: Secondary | ICD-10-CM | POA: Insufficient documentation

## 2024-06-21 DIAGNOSIS — M549 Dorsalgia, unspecified: Secondary | ICD-10-CM | POA: Insufficient documentation

## 2024-06-21 MED ORDER — ALPRAZOLAM 0.5 MG PO TABS: ORAL_TABLET | ORAL | 0 refills | Status: DC

## 2024-06-21 MED ORDER — CYCLOBENZAPRINE HCL 5 MG PO TABS: 5.0000 mg | ORAL_TABLET | Freq: Three times a day (TID) | ORAL | 1 refills | Status: DC | PRN

## 2024-06-21 MED ORDER — HYDROCODONE BIT-HOMATROP MBR 5-1.5 MG/5ML PO SOLN: 5.0000 mL | Freq: Four times a day (QID) | ORAL | 0 refills | Status: AC | PRN

## 2024-06-21 MED ORDER — NIRMATRELVIR/RITONAVIR (PAXLOVID)TABLET: 3.0000 | ORAL_TABLET | Freq: Two times a day (BID) | ORAL | 0 refills | Status: AC

## 2024-06-21 NOTE — Assessment & Plan Note (Signed)
 Mild recent without recurrence, likely precipitated by tomatos, exam benign, for TUMS prn,  to f/u any worsening symptoms or concerns

## 2024-06-21 NOTE — Assessment & Plan Note (Signed)
 Chronic mild mostly travel related and leaving for vacation next wk - for xanax  refill

## 2024-06-21 NOTE — Assessment & Plan Note (Signed)
 Mild to mod, for antibx course paxlovid , and cough med prn,  to f/u any worsening symptoms or concerns

## 2024-06-21 NOTE — Progress Notes (Signed)
 Patient ID: Lenae Wherley, female   DOB: 14-Jul-1996, 28 y.o.   MRN: [id number]        Chief Complaint: follow up mid back pain, indigestion episode, and COVID pos infection       HPI:  Bernardine Langworthy is a 28 y.o. female here with recent onset covid symptoms of fever, cough, congestion, but ok smell and taste.  Home testing positive this am.  Pt denies chest pain, increased sob or doe, wheezing, orthopnea, PND, increased LE swelling, palpitations, dizziness or syncope.   Pt denies polydipsia, polyuria, or new focal neuro s/s.    Pt denies other recent wt loss, night sweats, loss of appetite, or other constitutional symptoms  Also had a rather severe episode of indigestion with tomatoes last wk for several hours which resolved and not recurred.  Also goes to gym 5 times per wk now with bilateral mid back pain below the scapulas after working the arms with light weights.  Denies worsening depressive symptoms, suicidal ideation, or panic; has ongoing anxiety, asking for xanax  refill today.         Wt Readings from Last 3 Encounters:  06/21/24 108 lb 3.2 oz (49.1 kg)  05/23/24 109 lb (49.4 kg)  04/05/24 115 lb (52.2 kg)   BP Readings from Last 3 Encounters:  06/21/24 114/78  05/23/24 100/70  04/05/24 100/80         Past Medical History:  Diagnosis Date   Anxiety    Hashimoto's disease    Hypothyroidism    Medical history non-contributory    Past Surgical History:  Procedure Laterality Date   TONSILLECTOMY      reports that she has been smoking e-cigarettes. She has been exposed to tobacco smoke. She has never used smokeless tobacco. She reports current alcohol use. She reports that she does not currently use drugs. family history includes Cancer in her mother; Hypothyroidism in her maternal aunt and maternal grandmother; Pancreatic cancer in her father. Allergies  Allergen Reactions   Sulfa Antibiotics Other (See Comments)    Unsure of reaction mother told her she was alergic   Current  Outpatient Medications on File Prior to Visit  Medication Sig Dispense Refill   ibuprofen  (ADVIL ) 200 MG tablet Take 200 mg by mouth every 6 (six) hours as needed.     levothyroxine  (SYNTHROID ) 75 MCG tablet Take 1 tablet (75 mcg total) by mouth daily. 90 tablet 1   No current facility-administered medications on file prior to visit.        ROS:  All others reviewed and negative.  Objective        PE:  BP 114/78   Pulse 84   Temp 98.4 F (36.9 C)   Ht 5' 1 (1.549 m)   Wt 108 lb 3.2 oz (49.1 kg)   SpO2 99%   BMI 20.44 kg/m                 Constitutional: Pt appears mild ill               HENT: Head: NCAT.                Right Ear: External ear normal.                 Left Ear: External ear normal. Bilat tm's with mild erythema.  Max sinus areas mild tender.  Pharynx with mild erythema, no exudate  Eyes: . Pupils are equal, round, and reactive to light. Conjunctivae and EOM are normal               Nose: without d/c or deformity               Neck: Neck supple. Gross normal ROM               Cardiovascular: Normal rate and regular rhythm.                 Pulmonary/Chest: Effort normal and breath sounds without rales or wheezing. ; has bilateral thoracic paraspinal tension and tenderness left > right               Abd:  Soft, NT, ND, + BS, no organomegaly               Neurological: Pt is alert. At baseline orientation, motor grossly intact               Skin: Skin is warm. No rashes, no other new lesions, LE edema - none               Psychiatric: Pt behavior is normal without agitation , moderate nervous  Micro: none  Cardiac tracings I have personally interpreted today:  none  Pertinent Radiological findings (summarize): none   Lab Results  Component Value Date   WBC 6.9 12/17/2023   HGB 14.0 12/17/2023   HCT 41.8 12/17/2023   PLT 258.0 12/17/2023   GLUCOSE 78 12/17/2023   CHOL 151 09/22/2014   TRIG 56 09/22/2014   HDL 50 09/22/2014   LDLCALC 90  09/22/2014   ALT 14 12/17/2023   AST 20 12/17/2023   NA 141 12/17/2023   K 4.2 12/17/2023   CL 104 12/17/2023   CREATININE 1.03 12/17/2023   BUN 15 12/17/2023   CO2 26 12/17/2023   TSH 3.30 03/24/2024   Assessment/Plan:  Jaimy Kliethermes is a 28 y.o. Unavailable [8] female with  has a past medical history of Anxiety, Hashimoto's disease, Hypothyroidism, and Medical history non-contributory.  Indigestion Mild recent without recurrence, likely precipitated by tomatos, exam benign, for TUMS prn,  to f/u any worsening symptoms or concerns  Anxiety Chronic mild mostly travel related and leaving for vacation next wk - for xanax  refill  Back pain Exam c/w msk strain - for flexeril  5 tid prn  COVID-19 virus infection Mild to mod, for antibx course paxlovid , and cough med prn,  to f/u any worsening symptoms or concerns  Followup: Return if symptoms worsen or fail to improve.  Lynwood Rush, MD 06/21/2024 6:24 PM [facility]

## 2024-06-21 NOTE — Patient Instructions (Signed)
 Please take all new medication as prescribed - the paxlovid  antibiotic, and cough medicine as needed, and muscle relaxer as needed  Please continue all other medications as before, and refills have been done for xanax  to travel  Ok to try OTC TUMS for any further indigestion  Please have the pharmacy call with any other refills you may need.  Please keep your appointments with your specialists as you may have planned

## 2024-06-21 NOTE — Assessment & Plan Note (Signed)
 Exam c/w msk strain - for flexeril  5 tid prn

## 2024-07-04 ENCOUNTER — Other Ambulatory Visit

## 2024-07-06 ENCOUNTER — Ambulatory Visit: Admitting: "Endocrinology

## 2024-09-23 ENCOUNTER — Other Ambulatory Visit: Payer: Self-pay | Admitting: "Endocrinology

## 2024-09-25 NOTE — Telephone Encounter (Signed)
 Refill request complete

## 2024-09-29 ENCOUNTER — Ambulatory Visit: Admitting: Family Medicine

## 2024-10-20 ENCOUNTER — Other Ambulatory Visit

## 2024-10-20 LAB — TSH+FREE T4: TSH W/REFLEX TO FT4: 2.2 m[IU]/L

## 2024-10-27 ENCOUNTER — Ambulatory Visit: Admitting: "Endocrinology

## 2024-10-27 ENCOUNTER — Encounter: Payer: Self-pay | Admitting: "Endocrinology

## 2024-10-27 VITALS — BP 110/80 | HR 86 | Ht 61.0 in | Wt 107.0 lb

## 2024-10-27 DIAGNOSIS — E039 Hypothyroidism, unspecified: Secondary | ICD-10-CM

## 2024-10-27 MED ORDER — LEVOTHYROXINE SODIUM 75 MCG PO TABS: 75.0000 ug | ORAL_TABLET | Freq: Every day | ORAL | 3 refills | Status: AC

## 2024-10-27 NOTE — Progress Notes (Signed)
 "    Outpatient Endocrinology Note [name], MD  10/27/2024   Julie Bowman 1996-04-17 [id number]  Referring Provider: Lendia Boby CROME, NP-C Primary Care Provider: Lendia Boby CROME, NP-C Subjective  No chief complaint on file.  Assessment & Plan  Diagnoses and all orders for this visit:  Acquired hypothyroidism -     TSH + free T4  Other orders -     levothyroxine  (SYNTHROID ) 75 MCG tablet; Take 1 tablet (75 mcg total) by mouth daily before breakfast.   Julie Bowman is currently taking levothyroxine  75 mcg po qd. Patient is currently biochemically euthyroid.  Educated on thyroid  axis.  Recommend the following: Take levothyroxine  75 mcg every morning.  Advised to take levothyroxine  first thing in the morning on empty stomach and wait at least 30 minutes to 1 hour before eating or drinking anything or taking any other medications. Space out levothyroxine  by 4 hours from any acid reflux medication/fibrate/iron/calcium/multivitamin. Advised to take birth control pills and nutritional supplements in the evening. Repeat lab before next visit or sooner if symptoms of hyperthyroidism or hypothyroidism develop.  Notify us  immediately in case of pregnancy/breastfeeding or significant weight gain or loss. Counseled on compliance and follow up needs.  I have reviewed current medications, nurse's notes, allergies, vital signs, past medical and surgical history, family medical history, and social history for this encounter. Counseled patient on symptoms, examination findings, lab findings, imaging results, treatment decisions and monitoring and prognosis. The patient understood the recommendations and agrees with the treatment plan. All questions regarding treatment plan were fully answered.   Return in about 1 year (around 10/27/2025) for visit + labs before next visit.   [name], MD  10/27/2024   I have reviewed current medications, nurse's notes, allergies, vital signs,  past medical and surgical history, family medical history, and social history for this encounter. Counseled patient on symptoms, examination findings, lab findings, imaging results, treatment decisions and monitoring and prognosis. The patient understood the recommendations and agrees with the treatment plan. All questions regarding treatment plan were fully answered.   History of Present Illness Julie Bowman is a 28 y.o. year old female who presents to our clinic with hypothyroidism diagnosed in 12/2023 with TSH of 202, FT4 of 0.16 and TPO +.    Patient was concerned because her heart rate was up to 70s, which is higher than her baseline, at the time when she had diarrhea.  Reports that her heart rate has improved but she still had 1 episode of diarrhea today.  She cut down her dose a couple of days and then resumed her regular dose.  Symptoms suggestive of HYPOTHYROIDISM:  fatigue No weight gain No cold intolerance  Yes, baseline  constipation  No  Symptoms suggestive of HYPERTHYROIDISM:  weight loss  No heat intolerance No hyperdefecation  No palpitations  No, resolved  Compressive symptoms:  dysphagia  No dysphonia  No positional dyspnea (especially with simultaneous arms elevation)  No  Smokes  No On biotin  No Personal history of head/neck surgery/irradiation  Yes s/p B/L tonsillectomy   Physical Exam  BP 110/80   Pulse 86   Ht 5' 1 (1.549 m)   Wt 107 lb (48.5 kg)   SpO2 97%   BMI 20.22 kg/m  Constitutional: well developed, well nourished Head: normocephalic, atraumatic, no exophthalmos Eyes: sclera anicteric, no redness Neck: no thyromegaly, no thyroid  tenderness; no nodules palpated Lungs: normal respiratory effort Neurology: alert and oriented, no fine hand tremor Skin: dry, no  appreciable rashes Musculoskeletal: no appreciable defects Psychiatric: normal mood and affect  Allergies Allergies  Allergen Reactions   Sulfa Antibiotics Other (See Comments)     Unsure of reaction mother told her she was alergic    Current Medications Patient's Medications  New Prescriptions   No medications on file  Previous Medications   ALPRAZOLAM  (XANAX ) 0.5 MG TABLET    Take 1 tablet 30-45 minutes before needed   CYCLOBENZAPRINE  (FLEXERIL ) 5 MG TABLET    Take 1 tablet (5 mg total) by mouth 3 (three) times daily as needed.   IBUPROFEN  (ADVIL ) 200 MG TABLET    Take 200 mg by mouth every 6 (six) hours as needed.  Modified Medications   Modified Medication Previous Medication   LEVOTHYROXINE  (SYNTHROID ) 75 MCG TABLET levothyroxine  (SYNTHROID ) 75 MCG tablet      Take 1 tablet (75 mcg total) by mouth daily before breakfast.    TAKE 1 TABLET(75 MCG) BY MOUTH DAILY  Discontinued Medications   No medications on file    Past Medical History Past Medical History:  Diagnosis Date   Anxiety    Hashimoto's disease    Hypothyroidism    Medical history non-contributory     Past Surgical History Past Surgical History:  Procedure Laterality Date   TONSILLECTOMY      Family History family history includes Cancer in her mother; Hypothyroidism in her maternal aunt and maternal grandmother; Pancreatic cancer in her father.  Social History Social History   Socioeconomic History   Marital status: Single    Spouse name: Not on file   Number of children: Not on file   Years of education: Not on file   Highest education level: Not on file  Occupational History   Not on file  Tobacco Use   Smoking status: Every Day    Types: E-cigarettes    Passive exposure: Past   Smokeless tobacco: Never   Tobacco comments:    VAPES  Vaping Use   Vaping status: Every Day  Substance and Sexual Activity   Alcohol use: Yes    Comment: occ   Drug use: Not Currently   Sexual activity: Yes    Partners: Male    Birth control/protection: None    Comment: menarche 28yo, sexual debut 28yo  Other Topics Concern   Not on file  Social History Narrative   Not on file    Social Drivers of Health   Tobacco Use: High Risk (10/27/2024)   Patient History    Smoking Tobacco Use: Every Day    Smokeless Tobacco Use: Never    Passive Exposure: Past  Financial Resource Strain: Not on file  Food Insecurity: Not on file  Transportation Needs: Not on file  Physical Activity: Not on file  Stress: Not on file  Social Connections: Not on file  Intimate Partner Violence: Not on file  Depression (PHQ2-9): Low Risk (02/08/2024)   Depression (PHQ2-9)    PHQ-2 Score: 0  Alcohol Screen: Not on file  Housing: Not on file  Utilities: Not on file  Health Literacy: Not on file    Laboratory Investigations Lab Results  Component Value Date   TSH 3.30 03/24/2024   TSH 0.97 03/06/2024   TSH 3.11 01/19/2024   FREET4 0.16 (L) 12/17/2023     No results found for: TSI   No components found for: TRAB   Lab Results  Component Value Date   CHOL 151 09/22/2014   Lab Results  Component Value Date   HDL  50 09/22/2014   Lab Results  Component Value Date   LDLCALC 90 09/22/2014   Lab Results  Component Value Date   TRIG 56 09/22/2014   Lab Results  Component Value Date   CHOLHDL 3.0 09/22/2014   Lab Results  Component Value Date   CREATININE 1.03 12/17/2023   Lab Results  Component Value Date   GFR 74.54 12/17/2023      Component Value Date/Time   NA 141 12/17/2023 1452   K 4.2 12/17/2023 1452   CL 104 12/17/2023 1452   CO2 26 12/17/2023 1452   GLUCOSE 78 12/17/2023 1452   BUN 15 12/17/2023 1452   CREATININE 1.03 12/17/2023 1452   CALCIUM 9.8 12/17/2023 1452   PROT 7.8 12/17/2023 1452   ALBUMIN 5.1 12/17/2023 1452   AST 20 12/17/2023 1452   ALT 14 12/17/2023 1452   ALKPHOS 43 12/17/2023 1452   BILITOT 0.7 12/17/2023 1452   GFRNONAA >60 09/20/2020 1418   GFRAA >60 11/25/2018 1737      Latest Ref Rng & Units 12/17/2023    2:52 PM 09/20/2020    2:18 PM 11/25/2018    5:37 PM  BMP  Glucose 70 - 99 mg/dL 78  890  90   BUN 6 - 23 mg/dL 15   11  13    Creatinine 0.40 - 1.20 mg/dL 8.96  9.08  9.25   Sodium 135 - 145 mEq/L 141  136  135   Potassium 3.5 - 5.1 mEq/L 4.2  3.7  3.7   Chloride 96 - 112 mEq/L 104  102  106   CO2 19 - 32 mEq/L 26  23  22    Calcium 8.4 - 10.5 mg/dL 9.8  9.6  8.3        Component Value Date/Time   WBC 6.9 12/17/2023 1452   RBC 4.37 12/17/2023 1452   HGB 14.0 12/17/2023 1452   HCT 41.8 12/17/2023 1452   PLT 258.0 12/17/2023 1452   MCV 95.6 12/17/2023 1452   MCV 95.7 04/24/2013 2059   MCH 31.5 09/20/2020 1418   MCHC 33.6 12/17/2023 1452   RDW 13.3 12/17/2023 1452   LYMPHSABS 1.9 12/17/2023 1452   MONOABS 0.5 12/17/2023 1452   EOSABS 0.3 12/17/2023 1452   BASOSABS 0.1 12/17/2023 1452      Parts of this note may have been dictated using voice recognition software. There may be variances in spelling and vocabulary which are unintentional. Not all errors are proofread. Please notify the dino if any discrepancies are noted or if the meaning of any statement is not clear.    "

## 2024-11-24 ENCOUNTER — Ambulatory Visit: Payer: Self-pay | Admitting: Family Medicine

## 2024-11-24 ENCOUNTER — Encounter: Payer: Self-pay | Admitting: Family Medicine

## 2024-11-24 VITALS — BP 114/76 | HR 78 | Temp 97.8°F | Ht 61.0 in | Wt 105.0 lb

## 2024-11-24 DIAGNOSIS — M545 Low back pain, unspecified: Secondary | ICD-10-CM | POA: Diagnosis not present

## 2024-11-24 MED ORDER — METHOCARBAMOL 500 MG PO TABS: 500.0000 mg | ORAL_TABLET | Freq: Every evening | ORAL | 0 refills | Status: AC | PRN

## 2024-11-24 MED ORDER — MELOXICAM 15 MG PO TABS: 15.0000 mg | ORAL_TABLET | Freq: Every day | ORAL | 0 refills | Status: AC

## 2024-11-24 NOTE — Patient Instructions (Signed)
 Take meloxicam  daily with food for 1-2 weeks.   Use the Robaxin  as needed at bedtime. This medication is sedating.   Continue heating pad and you can also try over the counter pain medicine.

## 2024-11-24 NOTE — Progress Notes (Signed)
 "  Subjective:     Patient ID: Julie Bowman, female    DOB: 06-Nov-1996, 29 y.o.   MRN: [id number]  Chief Complaint  Patient presents with   Acute Visit    Lower back pain, flank pain x2 weeks. Works out, may be muscle strain. Hurts when twisting, bending to the side    HPI  Discussed the use of AI scribe software for clinical note transcription with the patient, who gave verbal consent to proceed.  History of Present Illness Julie Bowman is a 29 year old female who presents with back pain.  Musculoskeletal pain - Back pain began approximately two and a half weeks ago as mild discomfort in the lower back, worsened with sitting, and subsequently migrated to the flank and rib cage. - Pain is described as soreness on bending and twisting. - Lying on the right side exacerbates the pain. - Lower back pain has resolved; persistent discomfort remains in the flank region. - No radiation of pain to the buttocks or legs. - Onset associated with a workout; exercise has been avoided since onset. - Works a physically active job and feels her back is compensating for the pain.  Symptom management - Uses 200 mg ibuprofen  after work with partial relief when not active. - Applies a heating pad for symptom relief. - Has not tried topical treatments.  Associated symptoms and negative review of systems - No tenderness to touch. - No abdominal pain, urinary symptoms, fever, chills, or possibility of pregnancy.     Health Maintenance Due  Topic Date Due   HIV Screening  Never done   Hepatitis C Screening  Never done   Pneumococcal Vaccine (1 of 2 - PCV) Never done   Hepatitis B Vaccines 19-59 Average Risk (1 of 3 - 19+ 3-dose series) Never done   DTaP/Tdap/Td (2 - Td or Tdap) 06/23/2017   Influenza Vaccine  Never done   COVID-19 Vaccine (1 - 2025-26 season) Never done    Past Medical History:  Diagnosis Date   Anxiety    Hashimoto's disease    Hypothyroidism    Medical history  non-contributory     Past Surgical History:  Procedure Laterality Date   TONSILLECTOMY      Family History  Problem Relation Age of Onset   Cancer Mother        breast cancer, onset 36yo   Pancreatic cancer Father    Hypothyroidism Maternal Aunt    Hypothyroidism Maternal Grandmother     Social History   Socioeconomic History   Marital status: Single    Spouse name: Not on file   Number of children: Not on file   Years of education: Not on file   Highest education level: Not on file  Occupational History   Not on file  Tobacco Use   Smoking status: Every Day    Types: E-cigarettes    Passive exposure: Past   Smokeless tobacco: Never   Tobacco comments:    VAPES  Vaping Use   Vaping status: Every Day  Substance and Sexual Activity   Alcohol use: Yes    Comment: occ   Drug use: Not Currently   Sexual activity: Yes    Partners: Male    Birth control/protection: None    Comment: menarche 29yo, sexual debut 29yo  Other Topics Concern   Not on file  Social History Narrative   Not on file   Social Drivers of Health   Tobacco Use: High Risk (11/24/2024)  Patient History    Smoking Tobacco Use: Every Day    Smokeless Tobacco Use: Never    Passive Exposure: Past  Financial Resource Strain: Not on file  Food Insecurity: Not on file  Transportation Needs: Not on file  Physical Activity: Not on file  Stress: Not on file  Social Connections: Not on file  Intimate Partner Violence: Not on file  Depression (PHQ2-9): Low Risk (11/24/2024)   Depression (PHQ2-9)    PHQ-2 Score: 0  Alcohol Screen: Not on file  Housing: Not on file  Utilities: Not on file  Health Literacy: Not on file    Outpatient Medications Prior to Visit  Medication Sig Dispense Refill   ibuprofen  (ADVIL ) 200 MG tablet Take 200 mg by mouth every 6 (six) hours as needed.     levothyroxine  (SYNTHROID ) 75 MCG tablet Take 1 tablet (75 mcg total) by mouth daily before breakfast. 90 tablet 3    ALPRAZolam  (XANAX ) 0.5 MG tablet Take 1 tablet 30-45 minutes before needed 10 tablet 0   cyclobenzaprine  (FLEXERIL ) 5 MG tablet Take 1 tablet (5 mg total) by mouth 3 (three) times daily as needed. (Patient not taking: Reported on 11/24/2024) 40 tablet 1   No facility-administered medications prior to visit.    Allergies[1]  Review of Systems  Constitutional:  Negative for chills, fever and malaise/fatigue.  Respiratory:  Negative for shortness of breath.   Cardiovascular:  Negative for chest pain, palpitations and leg swelling.  Gastrointestinal:  Negative for abdominal pain, constipation, diarrhea, nausea and vomiting.  Genitourinary:  Negative for dysuria, frequency, hematuria and urgency.  Musculoskeletal:  Positive for back pain.  Skin:  Negative for rash.  Neurological:  Negative for dizziness and focal weakness.       Objective:    Physical Exam Constitutional:      General: She is not in acute distress.    Appearance: She is not ill-appearing.  Eyes:     Extraocular Movements: Extraocular movements intact.     Conjunctiva/sclera: Conjunctivae normal.  Cardiovascular:     Rate and Rhythm: Normal rate.  Pulmonary:     Effort: Pulmonary effort is normal.  Musculoskeletal:     Cervical back: Normal range of motion and neck supple.     Comments: Good sensation and ROM of back and right side. Non tender.   Skin:    General: Skin is warm and dry.  Neurological:     General: No focal deficit present.     Mental Status: She is alert and oriented to person, place, and time.  Psychiatric:        Mood and Affect: Mood normal.        Behavior: Behavior normal.        Thought Content: Thought content normal.      BP 114/76   Pulse 78   Temp 97.8 F (36.6 C) (Temporal)   Ht 5' 1 (1.549 m)   Wt 105 lb (47.6 kg)   SpO2 99%   BMI 19.84 kg/m  Wt Readings from Last 3 Encounters:  11/24/24 105 lb (47.6 kg)  10/27/24 107 lb (48.5 kg)  06/21/24 108 lb 3.2 oz (49.1 kg)        Assessment & Plan:   Problem List Items Addressed This Visit     Back pain - Primary   Relevant Medications   meloxicam  (MOBIC ) 15 MG tablet   methocarbamol  (ROBAXIN ) 500 MG tablet    Assessment and Plan Assessment & Plan Acute right-sided low back  pain Likely due to muscle strain, exacerbated by sitting and certain movements, improving with ibuprofen . No radiation to the leg, no urinary symptoms, and no tenderness on examination. Pain is reproducible with certain movements and is not associated with fever or chills. No imaging required at this time. - Prescribed meloxicam  for 1-2 weeks, to be taken once daily with food. - Advised against taking ibuprofen  while on meloxicam . - Recommended use of a heating pad for symptomatic relief. - Provided exercises for core strengthening and stretching. - Prescribed Robaxin  for use as needed for muscle relaxation, especially at night if pain interferes with sleep. - Advised against returning to strenuous activities until fully healed. - Provided a doctor's note for light duty work on Monday.     I have discontinued Cruzita Wilensky's ALPRAZolam  and cyclobenzaprine . I am also having her start on meloxicam  and methocarbamol . Additionally, I am having her maintain her ibuprofen  and levothyroxine .  Meds ordered this encounter  Medications   meloxicam  (MOBIC ) 15 MG tablet    Sig: Take 1 tablet (15 mg total) by mouth daily.    Dispense:  30 tablet    Refill:  0    Supervising Provider:   ROLLENE NORRIS A [[id number]]   methocarbamol  (ROBAXIN ) 500 MG tablet    Sig: Take 1 tablet (500 mg total) by mouth at bedtime as needed for muscle spasms.    Dispense:  30 tablet    Refill:  0    Supervising Provider:   ROLLENE NORRIS A [[id number]]       [1]  Allergies Allergen Reactions   Sulfa Antibiotics Other (See Comments)    Unsure of reaction mother told her she was alergic   "
